# Patient Record
Sex: Female | Born: 1950
Health system: Southern US, Community
[De-identification: ages and names within clinical notes are randomized; demographics above are authoritative.]

## PROBLEM LIST (undated history)

## (undated) DIAGNOSIS — E785 Hyperlipidemia, unspecified: Secondary | ICD-10-CM

## (undated) DIAGNOSIS — K219 Gastro-esophageal reflux disease without esophagitis: Secondary | ICD-10-CM

## (undated) DIAGNOSIS — M810 Age-related osteoporosis without current pathological fracture: Secondary | ICD-10-CM

## (undated) HISTORY — DX: Age-related osteoporosis without current pathological fracture: M81.0

## (undated) HISTORY — PX: NASAL SINUS SURGERY: SHX719

## (undated) HISTORY — DX: Hyperlipidemia, unspecified: E78.5

## (undated) HISTORY — DX: Gastro-esophageal reflux disease without esophagitis: K21.9

---

## 1999-05-09 ENCOUNTER — Encounter: Admission: RE | Admit: 1999-05-09 | Discharge: 1999-05-09 | Payer: Self-pay | Admitting: Family Medicine

## 1999-05-09 ENCOUNTER — Encounter: Payer: Self-pay | Admitting: Family Medicine

## 1999-05-12 ENCOUNTER — Other Ambulatory Visit: Admission: RE | Admit: 1999-05-12 | Discharge: 1999-05-12 | Payer: Self-pay | Admitting: Urology

## 1999-05-17 ENCOUNTER — Encounter: Payer: Self-pay | Admitting: Urology

## 1999-05-17 ENCOUNTER — Encounter: Admission: RE | Admit: 1999-05-17 | Discharge: 1999-05-17 | Payer: Self-pay | Admitting: Urology

## 1999-06-22 ENCOUNTER — Encounter: Admission: RE | Admit: 1999-06-22 | Discharge: 1999-07-19 | Payer: Self-pay | Admitting: Neurology

## 2000-05-07 ENCOUNTER — Encounter: Payer: Self-pay | Admitting: Family Medicine

## 2000-05-07 ENCOUNTER — Encounter: Admission: RE | Admit: 2000-05-07 | Discharge: 2000-05-07 | Payer: Self-pay | Admitting: Family Medicine

## 2000-08-07 ENCOUNTER — Other Ambulatory Visit: Admission: RE | Admit: 2000-08-07 | Discharge: 2000-08-07 | Payer: Self-pay | Admitting: Family Medicine

## 2001-02-27 ENCOUNTER — Other Ambulatory Visit: Admission: RE | Admit: 2001-02-27 | Discharge: 2001-02-27 | Payer: Self-pay | Admitting: Obstetrics and Gynecology

## 2001-08-21 ENCOUNTER — Encounter: Admission: RE | Admit: 2001-08-21 | Discharge: 2001-08-21 | Payer: Self-pay | Admitting: Family Medicine

## 2001-08-21 ENCOUNTER — Encounter: Payer: Self-pay | Admitting: Family Medicine

## 2004-03-11 ENCOUNTER — Encounter: Admission: RE | Admit: 2004-03-11 | Discharge: 2004-03-11 | Payer: Self-pay | Admitting: Family Medicine

## 2004-03-11 ENCOUNTER — Ambulatory Visit: Payer: Self-pay | Admitting: Gastroenterology

## 2004-07-05 ENCOUNTER — Ambulatory Visit: Payer: Self-pay | Admitting: Gastroenterology

## 2005-04-07 ENCOUNTER — Ambulatory Visit (HOSPITAL_COMMUNITY): Admission: RE | Admit: 2005-04-07 | Discharge: 2005-04-07 | Payer: Self-pay | Admitting: Family Medicine

## 2006-04-12 ENCOUNTER — Ambulatory Visit (HOSPITAL_COMMUNITY): Admission: RE | Admit: 2006-04-12 | Discharge: 2006-04-12 | Payer: Self-pay | Admitting: Family Medicine

## 2006-04-20 ENCOUNTER — Ambulatory Visit (HOSPITAL_COMMUNITY): Admission: RE | Admit: 2006-04-20 | Discharge: 2006-04-20 | Payer: Self-pay | Admitting: Family Medicine

## 2007-08-23 ENCOUNTER — Ambulatory Visit (HOSPITAL_COMMUNITY): Admission: RE | Admit: 2007-08-23 | Discharge: 2007-08-23 | Payer: Self-pay | Admitting: Family Medicine

## 2009-08-06 ENCOUNTER — Ambulatory Visit (HOSPITAL_COMMUNITY): Admission: RE | Admit: 2009-08-06 | Discharge: 2009-08-06 | Payer: Self-pay | Admitting: Family Medicine

## 2010-09-22 NOTE — H&P (Signed)
NTS SOAP Note  Vital Signs:  Vitals as of: 09/22/2010: Systolic 156: Diastolic 74: Heart Rate 76: Temp 97.62F: Height 97ft 5in: Weight 177Lbs 0 Ounces: Pain Level 0: BMI 29  BMI : 29.45 kg/m2  Subjective: This 60 Years 57 Months old Female presents for of screening TCS. Denies any GI complaints except occassional heartburn. No family h/o colon carcinoma.  Review of Symptoms:  Constitutional: unremarkable  Head: unremarkable  Eyes:unremarkable  Nose/Mouth/Throat:unremarkable  Cardiovascular:unremarkable  Respiratory: unremarkable  Gastrointestin heartburn Genitourinary: unremarkable  Musculoskeletal: unremarkable  dry Hematolgic/Lymphatic: unremarkable  Allergic/Immunologic:unremarkable     Past Medical History:Reviewed  Past Medical History  Surgical History: sinus surgery Medical Problems: High cholesterol Allergies: nkda Medications: omeprazole, simvastatin   Social History: Reviewed   Social History  Preferred Language: English ([place]) Race: White Ethnicity: Not Hispanic / Latino Age: 60 Years 8 Months Marital Status: M Alcohol: No Recreational drug(s): No   Smoking Status: Current every day smoker reviewed on 09/22/2010 Started Date: 01/09/1970 Packs per day: 1.00   Family History: Reviewed  Family History  Maternal Grandmother: Cancer     Objective Information:  General: Well appearing, well nourished in no distress.  Head: Atraumatic; no masses; no abnormalities  Neck: Supple without lymphadenopathy.  Heart: RRR, no murmur or gallop. Normal S1, S2. No S3, S4.  Lungs:CTA bilaterally, no wheezes, rhonchi, rales. Breathing unlabored.  Abdomen: Soft, NT/ND, normal bowel sounds, no HSM, no masses. No peritoneal signs.  deferred to procedure  Assessment: Need for screening colonoscopy  Diagnosis & Procedure: DiagnosisCode: V76.51, ProcedureCode: 16109,   Orders:     Plan:Scheduled for TCS on 09/27/10.    Patient Education:  Alternative treatments to surgery were discussed with patient (and family).Risks and benefits of procedure were fully explained to the patient (and family) who gave informed consent. Patient/family questions were addressed.  Follow-up: Pending Surgery

## 2010-09-26 MED ORDER — SODIUM CHLORIDE 0.45 % IV SOLN: Freq: Once | INTRAVENOUS | Status: DC

## 2010-09-27 ENCOUNTER — Ambulatory Visit (HOSPITAL_COMMUNITY)
Admission: RE | Admit: 2010-09-27 | Discharge: 2010-09-27 | Disposition: A | Discharge status: Home / Self Care | Payer: 59 | Source: Ambulatory Visit | Attending: General Surgery | Admitting: General Surgery

## 2010-09-27 ENCOUNTER — Encounter (HOSPITAL_COMMUNITY)
Admission: RE | Disposition: A | Discharge status: Home / Self Care | Payer: Self-pay | Source: Ambulatory Visit | Attending: General Surgery

## 2010-09-27 DIAGNOSIS — E7800 Pure hypercholesterolemia, unspecified: Secondary | ICD-10-CM | POA: Insufficient documentation

## 2010-09-27 DIAGNOSIS — Z1211 Encounter for screening for malignant neoplasm of colon: Secondary | ICD-10-CM | POA: Insufficient documentation

## 2010-09-27 HISTORY — PX: COLONOSCOPY: SHX5424

## 2010-09-27 SURGERY — COLONOSCOPY
Anesthesia: Moderate Sedation

## 2010-09-27 MED ORDER — MIDAZOLAM HCL 5 MG/5ML IJ SOLN
INTRAMUSCULAR | Status: DC | PRN
  Administered 2010-09-27: 4 mg via INTRAVENOUS
  Administered 2010-09-27: 1 mg via INTRAVENOUS

## 2010-09-27 MED ORDER — MEPERIDINE HCL 25 MG/ML IJ SOLN
INTRAMUSCULAR | Status: DC | PRN
  Administered 2010-09-27 (×2): 25 mg via INTRAVENOUS

## 2010-09-27 MED ORDER — MEPERIDINE HCL 100 MG/ML IJ SOLN
INTRAMUSCULAR | Status: AC
  Filled 2010-09-27: qty 1

## 2010-09-27 MED ORDER — MIDAZOLAM HCL 5 MG/5ML IJ SOLN
INTRAMUSCULAR | Status: AC
  Filled 2010-09-27: qty 5

## 2010-09-27 MED ORDER — STERILE WATER FOR IRRIGATION IR SOLN
Status: DC | PRN
  Administered 2010-09-27: 10:00:00

## 2010-09-27 NOTE — Op Note (Signed)
Patient:  Cindy Butler  DOB:  02/10/1950  MRN:  [id number]   Preop Diagnosis: Need for screening colonoscopy    Postop Diagnosis:  Same  Procedure:  TCS  Surgeon:  Franky Macho, MD  Anes:  Demerol 50mg  iv, versed 5mg  iv  Indications:  Patient is a 60 year old white female presents for screening colonoscopy. Risks and benefits of the procedure and bleeding or perforation were fully explained to the patient and informed consent.   Procedure note:  Patient was placed in the left lateral decubitus position after placement of monitored anesthesia record. Demerol and Versed were used throughout the procedure for anesthesia. Rectal examination was performed which was negative. Colonoscopy to the cecum without difficulty. Confirmation of placement of the cecum was done using transabdominal palpation and landmarks. Bowel preparation was excellent. The cecum was fully visualized and noted to normal limits. The ascending colon, transverse colon, descending colon, sigmoid colon, and rectum were all within normal limits. No abnormal lesions were noted. The dentate line was inspected no difficult limits. Color was back within the colon and rectum prior to removal colonoscopy  The patient tolerated she well was transferred back to the patient in stable condition.  Complications:  None    Specimen: none  Recommendations: A followup colonoscopy is recommended in 10 years.

## 2010-10-05 ENCOUNTER — Encounter (HOSPITAL_COMMUNITY): Payer: Self-pay | Admitting: General Surgery

## 2012-12-12 ENCOUNTER — Ambulatory Visit (HOSPITAL_COMMUNITY)
Admission: RE | Admit: 2012-12-12 | Discharge: 2012-12-12 | Disposition: A | Discharge status: Home / Self Care | Payer: 59 | Source: Ambulatory Visit | Attending: Family Medicine | Admitting: Family Medicine

## 2012-12-12 ENCOUNTER — Other Ambulatory Visit (HOSPITAL_COMMUNITY): Payer: Self-pay | Admitting: Family Medicine

## 2012-12-12 DIAGNOSIS — J449 Chronic obstructive pulmonary disease, unspecified: Secondary | ICD-10-CM | POA: Insufficient documentation

## 2012-12-12 DIAGNOSIS — F17200 Nicotine dependence, unspecified, uncomplicated: Secondary | ICD-10-CM | POA: Insufficient documentation

## 2012-12-12 DIAGNOSIS — Z139 Encounter for screening, unspecified: Secondary | ICD-10-CM

## 2012-12-12 DIAGNOSIS — I700 Atherosclerosis of aorta: Secondary | ICD-10-CM | POA: Insufficient documentation

## 2012-12-12 DIAGNOSIS — J4489 Other specified chronic obstructive pulmonary disease: Secondary | ICD-10-CM | POA: Insufficient documentation

## 2012-12-13 ENCOUNTER — Ambulatory Visit (HOSPITAL_COMMUNITY)
Admission: RE | Admit: 2012-12-13 | Discharge: 2012-12-13 | Disposition: A | Discharge status: Home / Self Care | Payer: 59 | Source: Ambulatory Visit | Attending: Family Medicine | Admitting: Family Medicine

## 2012-12-13 DIAGNOSIS — Z139 Encounter for screening, unspecified: Secondary | ICD-10-CM

## 2012-12-13 DIAGNOSIS — Z1231 Encounter for screening mammogram for malignant neoplasm of breast: Secondary | ICD-10-CM | POA: Insufficient documentation

## 2015-01-27 DIAGNOSIS — Z6826 Body mass index (BMI) 26.0-26.9, adult: Secondary | ICD-10-CM | POA: Diagnosis not present

## 2015-01-27 DIAGNOSIS — Z23 Encounter for immunization: Secondary | ICD-10-CM | POA: Diagnosis not present

## 2015-01-27 DIAGNOSIS — E782 Mixed hyperlipidemia: Secondary | ICD-10-CM | POA: Diagnosis not present

## 2015-01-27 DIAGNOSIS — Z0001 Encounter for general adult medical examination with abnormal findings: Secondary | ICD-10-CM | POA: Diagnosis not present

## 2015-02-02 DIAGNOSIS — Z23 Encounter for immunization: Secondary | ICD-10-CM | POA: Diagnosis not present

## 2015-02-02 DIAGNOSIS — E782 Mixed hyperlipidemia: Secondary | ICD-10-CM | POA: Diagnosis not present

## 2015-02-02 DIAGNOSIS — Z1389 Encounter for screening for other disorder: Secondary | ICD-10-CM | POA: Diagnosis not present

## 2015-02-02 DIAGNOSIS — Z0001 Encounter for general adult medical examination with abnormal findings: Secondary | ICD-10-CM | POA: Diagnosis not present

## 2015-03-11 DIAGNOSIS — Z6827 Body mass index (BMI) 27.0-27.9, adult: Secondary | ICD-10-CM | POA: Diagnosis not present

## 2015-03-11 DIAGNOSIS — K219 Gastro-esophageal reflux disease without esophagitis: Secondary | ICD-10-CM | POA: Diagnosis not present

## 2015-03-11 DIAGNOSIS — E782 Mixed hyperlipidemia: Secondary | ICD-10-CM | POA: Diagnosis not present

## 2015-03-11 DIAGNOSIS — E663 Overweight: Secondary | ICD-10-CM | POA: Diagnosis not present

## 2015-03-11 DIAGNOSIS — Z1389 Encounter for screening for other disorder: Secondary | ICD-10-CM | POA: Diagnosis not present

## 2015-03-11 DIAGNOSIS — F17290 Nicotine dependence, other tobacco product, uncomplicated: Secondary | ICD-10-CM | POA: Diagnosis not present

## 2015-03-11 DIAGNOSIS — R5383 Other fatigue: Secondary | ICD-10-CM | POA: Diagnosis not present

## 2015-03-11 DIAGNOSIS — Z01411 Encounter for gynecological examination (general) (routine) with abnormal findings: Secondary | ICD-10-CM | POA: Diagnosis not present

## 2015-03-11 DIAGNOSIS — Z23 Encounter for immunization: Secondary | ICD-10-CM | POA: Diagnosis not present

## 2015-03-15 ENCOUNTER — Other Ambulatory Visit: Payer: Self-pay | Admitting: Family Medicine

## 2015-03-16 ENCOUNTER — Other Ambulatory Visit: Payer: Self-pay | Admitting: Family Medicine

## 2015-03-16 DIAGNOSIS — Z1231 Encounter for screening mammogram for malignant neoplasm of breast: Secondary | ICD-10-CM

## 2015-03-16 DIAGNOSIS — R5381 Other malaise: Secondary | ICD-10-CM

## 2015-03-22 ENCOUNTER — Other Ambulatory Visit: Payer: Self-pay | Admitting: Family Medicine

## 2015-03-22 DIAGNOSIS — M8580 Other specified disorders of bone density and structure, unspecified site: Secondary | ICD-10-CM

## 2015-04-12 ENCOUNTER — Ambulatory Visit
Admission: RE | Admit: 2015-04-12 | Discharge: 2015-04-12 | Disposition: A | Discharge status: Home / Self Care | Payer: Medicare Other | Source: Ambulatory Visit | Attending: Family Medicine | Admitting: Family Medicine

## 2015-04-12 DIAGNOSIS — Z780 Asymptomatic menopausal state: Secondary | ICD-10-CM | POA: Diagnosis not present

## 2015-04-12 DIAGNOSIS — Z1231 Encounter for screening mammogram for malignant neoplasm of breast: Secondary | ICD-10-CM

## 2015-04-12 DIAGNOSIS — M8580 Other specified disorders of bone density and structure, unspecified site: Secondary | ICD-10-CM

## 2015-04-12 DIAGNOSIS — M8589 Other specified disorders of bone density and structure, multiple sites: Secondary | ICD-10-CM | POA: Diagnosis not present

## 2015-08-09 ENCOUNTER — Ambulatory Visit (INDEPENDENT_AMBULATORY_CARE_PROVIDER_SITE_OTHER): Payer: Medicare Other | Admitting: Otolaryngology

## 2015-08-09 DIAGNOSIS — H903 Sensorineural hearing loss, bilateral: Secondary | ICD-10-CM | POA: Diagnosis not present

## 2015-08-09 DIAGNOSIS — H9313 Tinnitus, bilateral: Secondary | ICD-10-CM

## 2015-11-26 DIAGNOSIS — E782 Mixed hyperlipidemia: Secondary | ICD-10-CM | POA: Diagnosis not present

## 2015-11-26 DIAGNOSIS — R070 Pain in throat: Secondary | ICD-10-CM | POA: Diagnosis not present

## 2015-11-26 DIAGNOSIS — J343 Hypertrophy of nasal turbinates: Secondary | ICD-10-CM | POA: Diagnosis not present

## 2015-11-26 DIAGNOSIS — Z6826 Body mass index (BMI) 26.0-26.9, adult: Secondary | ICD-10-CM | POA: Diagnosis not present

## 2015-11-26 DIAGNOSIS — J069 Acute upper respiratory infection, unspecified: Secondary | ICD-10-CM | POA: Diagnosis not present

## 2015-11-26 DIAGNOSIS — K219 Gastro-esophageal reflux disease without esophagitis: Secondary | ICD-10-CM | POA: Diagnosis not present

## 2015-11-26 DIAGNOSIS — Z1389 Encounter for screening for other disorder: Secondary | ICD-10-CM | POA: Diagnosis not present

## 2016-01-25 DIAGNOSIS — T161XXA Foreign body in right ear, initial encounter: Secondary | ICD-10-CM | POA: Diagnosis not present

## 2016-01-25 DIAGNOSIS — Z1389 Encounter for screening for other disorder: Secondary | ICD-10-CM | POA: Diagnosis not present

## 2016-01-25 DIAGNOSIS — Z6826 Body mass index (BMI) 26.0-26.9, adult: Secondary | ICD-10-CM | POA: Diagnosis not present

## 2016-02-24 DIAGNOSIS — H903 Sensorineural hearing loss, bilateral: Secondary | ICD-10-CM | POA: Diagnosis not present

## 2016-04-25 DIAGNOSIS — Z23 Encounter for immunization: Secondary | ICD-10-CM | POA: Diagnosis not present

## 2016-04-25 DIAGNOSIS — Z6825 Body mass index (BMI) 25.0-25.9, adult: Secondary | ICD-10-CM | POA: Diagnosis not present

## 2016-04-25 DIAGNOSIS — Z719 Counseling, unspecified: Secondary | ICD-10-CM | POA: Diagnosis not present

## 2016-04-25 DIAGNOSIS — E663 Overweight: Secondary | ICD-10-CM | POA: Diagnosis not present

## 2016-04-25 DIAGNOSIS — Z0000 Encounter for general adult medical examination without abnormal findings: Secondary | ICD-10-CM | POA: Diagnosis not present

## 2016-05-04 DIAGNOSIS — E785 Hyperlipidemia, unspecified: Secondary | ICD-10-CM | POA: Diagnosis not present

## 2016-05-04 DIAGNOSIS — Z23 Encounter for immunization: Secondary | ICD-10-CM | POA: Diagnosis not present

## 2016-05-04 DIAGNOSIS — Z1389 Encounter for screening for other disorder: Secondary | ICD-10-CM | POA: Diagnosis not present

## 2016-05-04 DIAGNOSIS — Z0000 Encounter for general adult medical examination without abnormal findings: Secondary | ICD-10-CM | POA: Diagnosis not present

## 2016-05-05 ENCOUNTER — Other Ambulatory Visit (HOSPITAL_COMMUNITY): Payer: Self-pay | Admitting: Family Medicine

## 2016-05-05 DIAGNOSIS — Z1231 Encounter for screening mammogram for malignant neoplasm of breast: Secondary | ICD-10-CM

## 2016-05-08 ENCOUNTER — Ambulatory Visit (HOSPITAL_COMMUNITY)
Admission: RE | Admit: 2016-05-08 | Discharge: 2016-05-08 | Disposition: A | Discharge status: Home / Self Care | Payer: Medicare Other | Source: Ambulatory Visit | Attending: Family Medicine | Admitting: Family Medicine

## 2016-05-08 DIAGNOSIS — Z1231 Encounter for screening mammogram for malignant neoplasm of breast: Secondary | ICD-10-CM | POA: Insufficient documentation

## 2016-08-07 ENCOUNTER — Ambulatory Visit (INDEPENDENT_AMBULATORY_CARE_PROVIDER_SITE_OTHER): Payer: Medicare Other | Admitting: Otolaryngology

## 2016-08-07 DIAGNOSIS — H903 Sensorineural hearing loss, bilateral: Secondary | ICD-10-CM

## 2016-09-05 DIAGNOSIS — M25562 Pain in left knee: Secondary | ICD-10-CM | POA: Diagnosis not present

## 2017-04-26 DIAGNOSIS — J449 Chronic obstructive pulmonary disease, unspecified: Secondary | ICD-10-CM | POA: Diagnosis not present

## 2017-04-26 DIAGNOSIS — Z719 Counseling, unspecified: Secondary | ICD-10-CM | POA: Diagnosis not present

## 2017-04-26 DIAGNOSIS — K219 Gastro-esophageal reflux disease without esophagitis: Secondary | ICD-10-CM | POA: Diagnosis not present

## 2017-04-26 DIAGNOSIS — Z1389 Encounter for screening for other disorder: Secondary | ICD-10-CM | POA: Diagnosis not present

## 2017-04-26 DIAGNOSIS — Z6827 Body mass index (BMI) 27.0-27.9, adult: Secondary | ICD-10-CM | POA: Diagnosis not present

## 2017-04-26 DIAGNOSIS — Z0001 Encounter for general adult medical examination with abnormal findings: Secondary | ICD-10-CM | POA: Diagnosis not present

## 2017-04-30 DIAGNOSIS — Z6827 Body mass index (BMI) 27.0-27.9, adult: Secondary | ICD-10-CM | POA: Diagnosis not present

## 2017-04-30 DIAGNOSIS — Z1389 Encounter for screening for other disorder: Secondary | ICD-10-CM | POA: Diagnosis not present

## 2017-04-30 DIAGNOSIS — Z0001 Encounter for general adult medical examination with abnormal findings: Secondary | ICD-10-CM | POA: Diagnosis not present

## 2017-04-30 DIAGNOSIS — E782 Mixed hyperlipidemia: Secondary | ICD-10-CM | POA: Diagnosis not present

## 2017-05-10 DIAGNOSIS — Z1389 Encounter for screening for other disorder: Secondary | ICD-10-CM | POA: Diagnosis not present

## 2017-05-10 DIAGNOSIS — Z1231 Encounter for screening mammogram for malignant neoplasm of breast: Secondary | ICD-10-CM | POA: Diagnosis not present

## 2017-05-10 DIAGNOSIS — R0989 Other specified symptoms and signs involving the circulatory and respiratory systems: Secondary | ICD-10-CM | POA: Diagnosis not present

## 2017-05-10 DIAGNOSIS — Z0001 Encounter for general adult medical examination with abnormal findings: Secondary | ICD-10-CM | POA: Diagnosis not present

## 2017-05-22 DIAGNOSIS — Z6827 Body mass index (BMI) 27.0-27.9, adult: Secondary | ICD-10-CM | POA: Diagnosis not present

## 2017-05-22 DIAGNOSIS — J019 Acute sinusitis, unspecified: Secondary | ICD-10-CM | POA: Diagnosis not present

## 2017-05-22 DIAGNOSIS — J029 Acute pharyngitis, unspecified: Secondary | ICD-10-CM | POA: Diagnosis not present

## 2017-05-22 DIAGNOSIS — H938X3 Other specified disorders of ear, bilateral: Secondary | ICD-10-CM | POA: Diagnosis not present

## 2017-05-22 DIAGNOSIS — E663 Overweight: Secondary | ICD-10-CM | POA: Diagnosis not present

## 2017-05-22 DIAGNOSIS — R0982 Postnasal drip: Secondary | ICD-10-CM | POA: Diagnosis not present

## 2017-05-22 DIAGNOSIS — R0981 Nasal congestion: Secondary | ICD-10-CM | POA: Diagnosis not present

## 2017-05-22 DIAGNOSIS — R05 Cough: Secondary | ICD-10-CM | POA: Diagnosis not present

## 2017-06-07 DIAGNOSIS — M85851 Other specified disorders of bone density and structure, right thigh: Secondary | ICD-10-CM | POA: Diagnosis not present

## 2017-06-07 DIAGNOSIS — M85852 Other specified disorders of bone density and structure, left thigh: Secondary | ICD-10-CM | POA: Diagnosis not present

## 2017-06-07 DIAGNOSIS — M810 Age-related osteoporosis without current pathological fracture: Secondary | ICD-10-CM | POA: Diagnosis not present

## 2017-08-16 ENCOUNTER — Ambulatory Visit (INDEPENDENT_AMBULATORY_CARE_PROVIDER_SITE_OTHER): Payer: Medicare Other | Admitting: Otolaryngology

## 2018-05-16 DIAGNOSIS — E7849 Other hyperlipidemia: Secondary | ICD-10-CM | POA: Diagnosis not present

## 2018-05-16 DIAGNOSIS — E663 Overweight: Secondary | ICD-10-CM | POA: Diagnosis not present

## 2018-05-16 DIAGNOSIS — F17290 Nicotine dependence, other tobacco product, uncomplicated: Secondary | ICD-10-CM | POA: Diagnosis not present

## 2018-05-16 DIAGNOSIS — J449 Chronic obstructive pulmonary disease, unspecified: Secondary | ICD-10-CM | POA: Diagnosis not present

## 2018-05-16 DIAGNOSIS — Z6828 Body mass index (BMI) 28.0-28.9, adult: Secondary | ICD-10-CM | POA: Diagnosis not present

## 2018-05-16 DIAGNOSIS — K219 Gastro-esophageal reflux disease without esophagitis: Secondary | ICD-10-CM | POA: Diagnosis not present

## 2018-05-16 DIAGNOSIS — Z1389 Encounter for screening for other disorder: Secondary | ICD-10-CM | POA: Diagnosis not present

## 2018-05-28 ENCOUNTER — Inpatient Hospital Stay (HOSPITAL_COMMUNITY): Payer: Medicare Other

## 2018-05-28 ENCOUNTER — Inpatient Hospital Stay (HOSPITAL_COMMUNITY): Payer: Medicare Other | Attending: Hematology | Admitting: Hematology

## 2018-05-28 ENCOUNTER — Encounter (HOSPITAL_COMMUNITY): Payer: Self-pay | Admitting: Hematology

## 2018-05-28 ENCOUNTER — Other Ambulatory Visit: Payer: Self-pay

## 2018-05-28 VITALS — BP 149/59 | HR 76 | Temp 97.9°F | Resp 18 | Wt 170.5 lb

## 2018-05-28 DIAGNOSIS — J449 Chronic obstructive pulmonary disease, unspecified: Secondary | ICD-10-CM | POA: Diagnosis not present

## 2018-05-28 DIAGNOSIS — Z808 Family history of malignant neoplasm of other organs or systems: Secondary | ICD-10-CM | POA: Diagnosis not present

## 2018-05-28 DIAGNOSIS — D696 Thrombocytopenia, unspecified: Secondary | ICD-10-CM

## 2018-05-28 DIAGNOSIS — Z803 Family history of malignant neoplasm of breast: Secondary | ICD-10-CM

## 2018-05-28 DIAGNOSIS — Z807 Family history of other malignant neoplasms of lymphoid, hematopoietic and related tissues: Secondary | ICD-10-CM | POA: Diagnosis not present

## 2018-05-28 DIAGNOSIS — Z720 Tobacco use: Secondary | ICD-10-CM | POA: Diagnosis not present

## 2018-05-28 LAB — CBC WITH DIFFERENTIAL/PLATELET
Band Neutrophils: 0 %
Basophils Absolute: 0 10*3/uL (ref 0.0–0.1)
Basophils Relative: 0 %
Blasts: 0 %
Eosinophils Absolute: 0.1 10*3/uL (ref 0.0–0.5)
Eosinophils Relative: 2 %
HCT: 40.7 % (ref 36.0–46.0)
Hemoglobin: 13.4 g/dL (ref 12.0–15.0)
Lymphocytes Relative: 36 %
Lymphs Abs: 1.3 10*3/uL (ref 0.7–4.0)
MCH: 32.4 pg (ref 26.0–34.0)
MCHC: 32.9 g/dL (ref 30.0–36.0)
MCV: 98.5 fL (ref 80.0–100.0)
Metamyelocytes Relative: 0 %
Monocytes Absolute: 0.3 10*3/uL (ref 0.1–1.0)
Monocytes Relative: 8 %
Myelocytes: 0 %
Neutro Abs: 2 10*3/uL (ref 1.7–7.7)
Neutrophils Relative %: 54 %
Other: 0 %
Platelets: 142 10*3/uL — ABNORMAL LOW (ref 150–400)
Promyelocytes Relative: 0 %
RBC: 4.13 MIL/uL (ref 3.87–5.11)
RDW: 12.6 % (ref 11.5–15.5)
WBC: 3.7 10*3/uL — ABNORMAL LOW (ref 4.0–10.5)
nRBC: 0 % (ref 0.0–0.2)
nRBC: 0 /100 WBC

## 2018-05-28 LAB — SAVE SMEAR (SSMR)

## 2018-05-28 LAB — FOLATE: Folate: 7.3 ng/mL (ref >5.9)

## 2018-05-28 LAB — LACTATE DEHYDROGENASE: LDH: 159 U/L (ref 98–192)

## 2018-05-28 LAB — VITAMIN B12: Vitamin B-12: 222 pg/mL (ref 180–914)

## 2018-05-28 NOTE — Progress Notes (Signed)
[facility] Initial Visit:  Patient Care Team: Cindy Lincoln, MD as PCP - General (Family Medicine)  CHIEF COMPLAINTS/PURPOSE OF CONSULTATION: Consult for Thrombocytopenia  HISTORY OF PRESENTING ILLNESS: Cindy Butler 68 y.o. female presents for consult regarding newly diagnosed thrombocytopenia. She has a past medical history significant for hyperlipidemia, GERD, COPD, and tobacco abuse. She was recently seen by her PCP for routine follow up. Labs revealed  WBC:4.0, Hgb 14.0, Plt: 141k. She denies a history of thrombocytopenia. She denies any episodes of bleeding. She denies history of autoimmune disorders. Denies history of liver disease including Hepatitis B/C. She denies any fevers, chills, night sweats, no abnormal weight gain or loss. Denies any CP, SOB, lighthededness or dizziness. She has a family history positive for Myeloma in her brother, bone cancer in her mother, and maternal grandmother had breast cancer. She is a current every day smoker, smoking 1ppd  for greater than 50 years.    Review of Systems  Constitutional: Negative.   HENT:  Negative.   Eyes: Negative.   Respiratory: Negative.   Cardiovascular: Negative.   Gastrointestinal: Negative.   Endocrine: Negative.   Genitourinary: Negative.    Musculoskeletal: Negative.   Skin: Negative.   Neurological: Negative.   Hematological: Negative.   Psychiatric/Behavioral: Negative.     MEDICAL HISTORY: Past Medical History:  Diagnosis Date  . Esophageal reflux   . Hyperlipidemia   . Osteoporosis     SURGICAL HISTORY: Past Surgical History:  Procedure Laterality Date  . COLONOSCOPY  09/27/2010   Procedure: COLONOSCOPY;  Surgeon: Cindy Butler;  Location: [facility];  Service: Gastroenterology;  Laterality: N/A;  . NASAL SINUS SURGERY      SOCIAL HISTORY: Social History   Socioeconomic History  . Marital status: Married    Spouse name: Cindy Butler  . Number of children: 3  . Years  of education: Not on file  . Highest education level: Not on file  Occupational History  . Occupation: retired  Scientific laboratory technician  . Financial resource strain: Not hard at all  . Food insecurity:    Worry: Never true    Inability: Never true  . Transportation needs:    Medical: No    Non-medical: No  Tobacco Use  . Smoking status: Current Every Day Smoker    Packs/day: 0.50    Years: 55.00    Pack years: 27.50    Types: Cigarettes  . Smokeless tobacco: Never Used  Substance and Sexual Activity  . Alcohol use: Yes    Alcohol/week: 3.0 standard drinks    Types: 3 Standard drinks or equivalent per week  . Drug use: Never  . Sexual activity: Not on file  Lifestyle  . Physical activity:    Days per week: 0 days    Minutes per session: 0 min  . Stress: Not at all  Relationships  . Social connections:    Talks on phone: Twice a week    Gets together: Once a week    Attends religious service: 1 to 4 times per year    Active member of club or organization: No    Attends meetings of clubs or organizations: Never    Relationship status: Married  . Intimate partner violence:    Fear of current or ex partner: No    Emotionally abused: No    Physically abused: No    Forced sexual activity: No  Other Topics Concern  . Not on file  Social History Narrative  .  Not on file    FAMILY HISTORY Family History  Problem Relation Age of Onset  . Bone cancer Mother   . Alcohol abuse Father   . Melanoma Brother   . Breast cancer Maternal Grandmother     ALLERGIES:  has No Known Allergies.  MEDICATIONS:  Current Outpatient Medications  Medication Sig Dispense Refill  . aspirin EC 81 MG tablet Take 81 mg by mouth at bedtime.    . calcium-vitamin D 250-100 MG-UNIT tablet Take 1 tablet by mouth daily.    . ferrous sulfate 325 (65 FE) MG tablet Take 325 mg by mouth daily with breakfast.    . omeprazole (PRILOSEC) 40 MG capsule Take 40 mg by mouth daily.     . simvastatin (ZOCOR) 20 MG  tablet Take 10 mg by mouth daily at 6 PM.      No current facility-administered medications for this visit.     PHYSICAL EXAMINATION:  ECOG PERFORMANCE STATUS: 1 - Symptomatic but completely ambulatory   Vitals:   05/28/18 1305  BP: (!) 149/59  Pulse: 76  Resp: 18  Temp: 97.9 F (36.6 C)  SpO2: 100%    Filed Weights   05/28/18 1305  Weight: 170 lb 8 oz (77.3 kg)     Physical Exam Vitals signs reviewed.  Constitutional:      Appearance: Normal appearance.  Cardiovascular:     Rate and Rhythm: Normal rate and regular rhythm.     Heart sounds: Normal heart sounds.  Pulmonary:     Effort: Pulmonary effort is normal.     Breath sounds: Normal breath sounds.  Abdominal:     General: There is no distension.     Palpations: Abdomen is soft. There is no mass.  Musculoskeletal:        General: No swelling.  Lymphadenopathy:     Cervical: No cervical adenopathy.  Skin:    General: Skin is warm.  Neurological:     General: No focal deficit present.     Mental Status: She is alert and oriented to person, place, and time.  Psychiatric:        Mood and Affect: Mood normal.        Behavior: Behavior normal.      LABORATORY DATA: I have personally reviewed the data as listed:   RADIOGRAPHIC STUDIES: I have personally reviewed the radiological images as listed and agree with the findings in the report   ASSESSMENT/PLAN  Thrombocytopenia ([place]) 1.  Mild thrombocytopenia: -Recent labs on 05/16/2018 showed platelet count of 141 (lower limit of normal 150).  White count and hemoglobin was normal. -She does not have any history of easy bruising or bleeding.  Denies any prior history of transfusions. -No personal or family history of connective tissue disorders.  Denies any recent new medications. - Family history significant for brother who died of multiple myeloma in his 56s, mother had metastatic cancer to the bones in her 83s.  Paternal grandmother had breast  cancer. -Physical exam was within normal limits.  No lymphadenopathy or splenomegaly.  Denies any B symptoms. - We will repeat a CBC with differential today and review her smear.  We will check LDH level.  We will rule out nutritional deficiencies by checking D62, folic acid.  We will also check for hepatitis panel.  We will do screening test for lupus and rheumatoid arthritis. -We will see her back in 2 to 3 weeks to discuss the results.  2.  Smoking history: - After the  patient left, I have realized that patient has been smoking 1 pack/day since age 44. -I will talk to her about screening low-dose CT scan and its benefits at next visit.   Orders Placed This Encounter  Procedures  . CBC with Differential    Standing Status:   Future    Number of Occurrences:   1    Standing Expiration Date:   05/28/2019  . Vitamin B12    Standing Status:   Future    Number of Occurrences:   1    Standing Expiration Date:   05/28/2019  . Folate    Standing Status:   Future    Number of Occurrences:   1    Standing Expiration Date:   05/28/2019  . ANA, IFA (with reflex)    Standing Status:   Future    Number of Occurrences:   1    Standing Expiration Date:   05/28/2019  . Rheumatoid factor    Standing Status:   Future    Number of Occurrences:   1    Standing Expiration Date:   05/28/2019  . Lactate dehydrogenase    Standing Status:   Future    Number of Occurrences:   1    Standing Expiration Date:   05/28/2019  . Hepatitis B surface antigen    Standing Status:   Future    Number of Occurrences:   1    Standing Expiration Date:   05/28/2019  . Hepatitis B surface antibody    Standing Status:   Future    Number of Occurrences:   1    Standing Expiration Date:   05/28/2019  . Hepatitis C Antibody    Standing Status:   Future    Number of Occurrences:   1    Standing Expiration Date:   05/28/2019  . Save Smear (SSMR)    Standing Status:   Future    Number of Occurrences:   1    Standing  Expiration Date:   05/28/2019    All questions were answered. The patient knows to call the clinic with any problems, questions or concerns.  This note was electronically signed.    Derek Jack, MD  05/28/2018 3:32 PM

## 2018-05-28 NOTE — Patient Instructions (Signed)
[place] at [facility] Discharge Instructions  You were seen today by Dr. Delton Coombes. He went over your history, family history and recent lab results from your primary care doctor. He would like to get blood work today to further evaluate you. He will see you back in 3-4 weeks for follow up.   Thank you for choosing [place] at [facility] to provide your oncology and hematology care.  To afford each patient quality time with our provider, please arrive at least 15 minutes before your scheduled appointment time.   If you have a lab appointment with the [facility] please come in thru the  Main Entrance and check in at the main information desk  You need to re-schedule your appointment should you arrive 10 or more minutes late.  We strive to give you quality time with our providers, and arriving late affects you and other patients whose appointments are after yours.  Also, if you no show three or more times for appointments you may be dismissed from the clinic at the providers discretion.     Again, thank you for choosing [facility].  Our hope is that these requests will decrease the amount of time that you wait before being seen by our physicians.       _____________________________________________________________  Should you have questions after your visit to [facility], please contact our office at [phone] between the hours of 8:00 a.m. and 4:30 p.m.  Voicemails left after 4:00 p.m. will not be returned until the following business day.  For prescription refill requests, have your pharmacy contact our office and allow 72 hours.    Cancer Center Support Programs:   > Cancer Support Group  2nd Tuesday of the month 1pm-2pm, Journey Room

## 2018-05-28 NOTE — Assessment & Plan Note (Addendum)
1.  Mild thrombocytopenia: -Recent labs on 05/16/2018 showed platelet count of 141 (lower limit of normal 150).  White count and hemoglobin was normal. -She does not have any history of easy bruising or bleeding.  Denies any prior history of transfusions. -No personal or family history of connective tissue disorders.  Denies any recent new medications. - Family history significant for brother who died of multiple myeloma in his 10s, mother had metastatic cancer to the bones in her 60s.  Paternal grandmother had breast cancer. -Physical exam was within normal limits.  No lymphadenopathy or splenomegaly.  Denies any B symptoms. - We will repeat a CBC with differential today and review her smear.  We will check LDH level.  We will rule out nutritional deficiencies by checking C12, folic acid.  We will also check for hepatitis panel.  We will do screening test for lupus and rheumatoid arthritis. -We will see her back in 2 to 3 weeks to discuss the results.  2.  Smoking history: - After the patient left, I have realized that patient has been smoking 1 pack/day since age 68. -I will talk to her about screening low-dose CT scan and its benefits at next visit.

## 2018-05-29 LAB — HEPATITIS B SURFACE ANTIBODY,QUALITATIVE: Hep B S Ab: NONREACTIVE

## 2018-05-29 LAB — FANA STAINING PATTERNS: Homogeneous Pattern: 1:80 {titer}

## 2018-05-29 LAB — HEPATITIS B SURFACE ANTIGEN: Hepatitis B Surface Ag: NEGATIVE

## 2018-05-29 LAB — ANTINUCLEAR ANTIBODIES, IFA: ANA Ab, IFA: POSITIVE — AB

## 2018-05-29 LAB — RHEUMATOID FACTOR: Rhuematoid fact SerPl-aCnc: <10 IU/mL (ref 0.0–13.9)

## 2018-05-29 LAB — HEPATITIS C ANTIBODY: HCV Ab: <0.1 s/co ratio (ref 0.0–0.9)

## 2018-06-05 DIAGNOSIS — Z1389 Encounter for screening for other disorder: Secondary | ICD-10-CM | POA: Diagnosis not present

## 2018-06-05 DIAGNOSIS — Z0000 Encounter for general adult medical examination without abnormal findings: Secondary | ICD-10-CM | POA: Diagnosis not present

## 2018-06-05 DIAGNOSIS — Z681 Body mass index (BMI) 19 or less, adult: Secondary | ICD-10-CM | POA: Diagnosis not present

## 2018-06-05 DIAGNOSIS — Z23 Encounter for immunization: Secondary | ICD-10-CM | POA: Diagnosis not present

## 2018-06-24 ENCOUNTER — Ambulatory Visit (HOSPITAL_COMMUNITY): Payer: Medicare Other | Admitting: Hematology

## 2018-06-25 ENCOUNTER — Other Ambulatory Visit: Payer: Self-pay

## 2018-06-25 ENCOUNTER — Encounter (HOSPITAL_COMMUNITY): Payer: Self-pay | Admitting: Hematology

## 2018-06-25 ENCOUNTER — Inpatient Hospital Stay (HOSPITAL_COMMUNITY): Payer: Medicare Other | Attending: Hematology | Admitting: Hematology

## 2018-06-25 VITALS — BP 134/78 | HR 87 | Temp 97.9°F | Resp 18 | Wt 170.8 lb

## 2018-06-25 DIAGNOSIS — D696 Thrombocytopenia, unspecified: Secondary | ICD-10-CM

## 2018-06-25 DIAGNOSIS — E538 Deficiency of other specified B group vitamins: Secondary | ICD-10-CM | POA: Diagnosis not present

## 2018-06-25 DIAGNOSIS — Z720 Tobacco use: Secondary | ICD-10-CM | POA: Diagnosis not present

## 2018-06-25 DIAGNOSIS — Z87891 Personal history of nicotine dependence: Secondary | ICD-10-CM

## 2018-06-25 MED ORDER — CYANOCOBALAMIN 1000 MCG/ML IJ SOLN
1000.0000 ug | Freq: Once | INTRAMUSCULAR | Status: AC
  Administered 2018-06-25: 15:00:00 1000 ug via INTRAMUSCULAR
  Filled 2018-06-25: qty 1

## 2018-06-25 NOTE — Patient Instructions (Signed)
[place] at [facility] Discharge Instructions  You were seen today by Dr. Delton Coombes. He went over your recent lab results. He will see you back in 4 weeks for labs and follow up.  Start taking daily B12.  Thank you for choosing [place] at [facility] to provide your oncology and hematology care.  To afford each patient quality time with our provider, please arrive at least 15 minutes before your scheduled appointment time.   If you have a lab appointment with the [place] please come in thru the  Main Entrance and check in at the main information desk  You need to re-schedule your appointment should you arrive 10 or more minutes late.  We strive to give you quality time with our providers, and arriving late affects you and other patients whose appointments are after yours.  Also, if you no show three or more times for appointments you may be dismissed from the clinic at the providers discretion.     Again, thank you for choosing [facility].  Our hope is that these requests will decrease the amount of time that you wait before being seen by our physicians.       _____________________________________________________________  Should you have questions after your visit to [facility], please contact our office at [phone] between the hours of 8:00 a.m. and 4:30 p.m.  Voicemails left after 4:00 p.m. will not be returned until the following business day.  For prescription refill requests, have your pharmacy contact our office and allow 72 hours.    Cancer Center Support Programs:   > Cancer Support Group  2nd Tuesday of the month 1pm-2pm, Journey Room

## 2018-06-25 NOTE — Assessment & Plan Note (Signed)
1.  Mild thrombocytopenia: -Labs on 05/16/2018 showed platelet count of 141 with normal white count and hemoglobin. -He did not have any history of easy bruising or bleeding.  No family history of personal history of CT disorders. - Physical exam was within normal limits with no lymphadenopathy or splenomegaly.  No B symptoms. - We repeated her blood work which showed platelet count of 142 with a normal hemoglobin of 13.4.  White count was 3.7 with normal ANC. -ANA was weakly positive, 1:80.  Rheumatoid factor is negative.  She does not have any clinical signs or symptoms of lupus.  Folic acid and hepatitis panel were negative.  LDH was normal. -B12 was borderline low at 222.  She will be given a B12 injection today.  She was told to take 1 mg B12 tablet sublingually every day. -We will see her back in 4 weeks and repeat CBC and B12 level.  2.  Smoking history: -She has been smoking 1 pack/day since age 60. -I have discussed the recommendations of low-dose CT scan and its implications in detail.  She is agreeable for low-dose screening CT of the chest.  We will schedule it prior to next visit.

## 2018-06-25 NOTE — Progress Notes (Signed)
Patient tolerated injection with no complaints voiced.  Site clean and dry with no bruising or swelling noted at site.  Band aid applied.  Vss with discharge and left ambulatory with no s/s of distress noted.  

## 2018-06-25 NOTE — Progress Notes (Signed)
Patient Care Team: Elsie Lincoln, MD as PCP - General (Family Medicine)  DIAGNOSIS:  Encounter Diagnoses  Name Primary?  . B12 deficiency Yes  . Smoking history   . Stopped smoking with greater than 30 pack year history   . Thrombocytopenia ([facility])       CHIEF COMPLIANT: Mild thrombocytopenia.  INTERVAL HISTORY: Cindy Butler is a 68 year old very pleasant female seen for follow-up of mild thrombocytopenia.  She denied any easy bruising or bleeding.  Denies any fevers, night sweats or weight loss.  Appetite and energy levels are 100%.  No pain is reported.  She is active smoker, smokes 1 pack/day of cigarettes since age 76.  Denies any chest pains or weight loss.  No cough or hemoptysis was reported.  Denies any tingling or numbness in extremities.  No recent ER visits or hospitalizations.  REVIEW OF SYSTEMS:   Constitutional: Denies fevers, chills or abnormal weight loss Eyes: Denies blurriness of vision Ears, nose, mouth, throat, and face: Denies mucositis or sore throat Respiratory: Denies cough, dyspnea or wheezes Cardiovascular: Denies palpitation, chest discomfort Gastrointestinal:  Denies nausea, heartburn or change in bowel habits Skin: Denies abnormal skin rashes Lymphatics: Denies new lymphadenopathy or easy bruising Neurological:Denies numbness, tingling or new weaknesses Behavioral/Psych: Mood is stable, no new changes  Extremities: No lower extremity edema All other systems were reviewed with the patient and are negative.  I have reviewed the past medical history, past surgical history, social history and family history with the patient and they are unchanged from previous note.  ALLERGIES:  has No Known Allergies.  MEDICATIONS:  Current Outpatient Medications  Medication Sig Dispense Refill  . aspirin EC 81 MG tablet Take 81 mg by mouth at bedtime.    . calcium-vitamin D 250-100 MG-UNIT tablet Take 1 tablet by mouth daily.    . ferrous sulfate 325 (65  FE) MG tablet Take 325 mg by mouth daily with breakfast.    . omeprazole (PRILOSEC) 40 MG capsule Take 40 mg by mouth daily.     . simvastatin (ZOCOR) 20 MG tablet Take 10 mg by mouth daily at 6 PM.      No current facility-administered medications for this visit.     PHYSICAL EXAMINATION: ECOG PERFORMANCE STATUS: 1 - Symptomatic but completely ambulatory  Vitals:   06/25/18 1454  BP: 134/78  Pulse: 87  Resp: 18  Temp: 97.9 F (36.6 C)  SpO2: 100%   Filed Weights   06/25/18 1454  Weight: 170 lb 12.8 oz (77.5 kg)    GENERAL:alert, no distress and comfortable SKIN: skin color, texture, turgor are normal, no rashes or significant lesions EYES: normal, Conjunctiva are pink and non-injected, sclera clear OROPHARYNX:no mucositis, no erythema and lips, buccal mucosa, and tongue normal  NECK: supple, thyroid normal size, non-tender, without nodularity LYMPH:  no palpable lymphadenopathy in the cervical, axillary or inguinal LUNGS: clear to auscultation and percussion with normal breathing effort HEART: regular rate & rhythm and no murmurs and no lower extremity edema ABDOMEN:abdomen soft, non-tender and normal bowel sounds MUSCULOSKELETAL:no cyanosis of digits and no clubbing  EXTREMITIES: No lower extremity edema   LABORATORY DATA:  I have reviewed the data as listed   Lab Results  Component Value Date   WBC 3.7 (L) 05/28/2018   HGB 13.4 05/28/2018   HCT 40.7 05/28/2018   MCV 98.5 05/28/2018   PLT 142 (L) 05/28/2018   NEUTROABS 2.0 05/28/2018    ASSESSMENT & PLAN:  Thrombocytopenia ([place]) 1.  Mild thrombocytopenia: -Labs on 05/16/2018 showed platelet count of 141 with normal white count and hemoglobin. -He did not have any history of easy bruising or bleeding.  No family history of personal history of CT disorders. - Physical exam was within normal limits with no lymphadenopathy or splenomegaly.  No B symptoms. - We repeated her blood work which showed platelet count of  142 with a normal hemoglobin of 13.4.  White count was 3.7 with normal ANC. -ANA was weakly positive, 1:80.  Rheumatoid factor is negative.  She does not have any clinical signs or symptoms of lupus.  Folic acid and hepatitis panel were negative.  LDH was normal. -B12 was borderline low at 222.  She will be given a B12 injection today.  She was told to take 1 mg B12 tablet sublingually every day. -We will see her back in 4 weeks and repeat CBC and B12 level.  2.  Smoking history: -She has been smoking 1 pack/day since age 86. -I have discussed the recommendations of low-dose CT scan and its implications in detail.  She is agreeable for low-dose screening CT of the chest.  We will schedule it prior to next visit.    I spent 25 minutes talking to the patient of which more than half was spent in counseling and coordination of care.  Orders Placed This Encounter  Procedures  . CT CHEST LUNG CA SCREEN LOW DOSE W/O CM    Standing Status:   Future    Standing Expiration Date:   08/25/2019    Order Specific Question:   ** REASON FOR EXAM (FREE TEXT)    Answer:   40 + year smoking history    Order Specific Question:   Reason for Exam (SYMPTOM  OR DIAGNOSIS REQUIRED)    Answer:   39 + year smoking history    Order Specific Question:   Preferred Imaging Location?    Answer:   [facility]    Order Specific Question:   Radiology Contrast Protocol - do NOT remove file path    Answer:   [email or web address]  . CBC with Differential    Standing Status:   Future    Standing Expiration Date:   06/25/2019  . Vitamin B12    Standing Status:   Future    Standing Expiration Date:   06/25/2019   The patient has a good understanding of the overall plan. she agrees with it. she will call with any problems that may develop before the next visit here.   Derek Jack, MD 06/25/18

## 2018-07-25 ENCOUNTER — Inpatient Hospital Stay (HOSPITAL_COMMUNITY): Payer: Medicare Other | Attending: Hematology

## 2018-07-25 ENCOUNTER — Other Ambulatory Visit: Payer: Self-pay

## 2018-07-25 ENCOUNTER — Ambulatory Visit (HOSPITAL_COMMUNITY)
Admission: RE | Admit: 2018-07-25 | Discharge: 2018-07-25 | Disposition: A | Discharge status: Home / Self Care | Payer: Medicare Other | Source: Ambulatory Visit | Attending: Hematology | Admitting: Hematology

## 2018-07-25 ENCOUNTER — Other Ambulatory Visit: Payer: Medicare Other

## 2018-07-25 DIAGNOSIS — Z720 Tobacco use: Secondary | ICD-10-CM | POA: Insufficient documentation

## 2018-07-25 DIAGNOSIS — F17210 Nicotine dependence, cigarettes, uncomplicated: Secondary | ICD-10-CM | POA: Diagnosis not present

## 2018-07-25 DIAGNOSIS — Z20822 Contact with and (suspected) exposure to covid-19: Secondary | ICD-10-CM

## 2018-07-25 DIAGNOSIS — R911 Solitary pulmonary nodule: Secondary | ICD-10-CM | POA: Diagnosis not present

## 2018-07-25 DIAGNOSIS — Z87891 Personal history of nicotine dependence: Secondary | ICD-10-CM | POA: Insufficient documentation

## 2018-07-25 DIAGNOSIS — R6889 Other general symptoms and signs: Secondary | ICD-10-CM | POA: Diagnosis not present

## 2018-07-25 DIAGNOSIS — E538 Deficiency of other specified B group vitamins: Secondary | ICD-10-CM

## 2018-07-25 DIAGNOSIS — D696 Thrombocytopenia, unspecified: Secondary | ICD-10-CM | POA: Diagnosis not present

## 2018-07-25 LAB — CBC WITH DIFFERENTIAL/PLATELET
Abs Immature Granulocytes: 0 10*3/uL (ref 0.00–0.07)
Basophils Absolute: 0 10*3/uL (ref 0.0–0.1)
Basophils Relative: 0 %
Eosinophils Absolute: 0.1 10*3/uL (ref 0.0–0.5)
Eosinophils Relative: 2 %
HCT: 42.5 % (ref 36.0–46.0)
Hemoglobin: 13.7 g/dL (ref 12.0–15.0)
Immature Granulocytes: 0 %
Lymphocytes Relative: 33 %
Lymphs Abs: 1.5 10*3/uL (ref 0.7–4.0)
MCH: 31.9 pg (ref 26.0–34.0)
MCHC: 32.2 g/dL (ref 30.0–36.0)
MCV: 99.1 fL (ref 80.0–100.0)
Monocytes Absolute: 0.5 10*3/uL (ref 0.1–1.0)
Monocytes Relative: 10 %
Neutro Abs: 2.5 10*3/uL (ref 1.7–7.7)
Neutrophils Relative %: 55 %
Platelets: 156 10*3/uL (ref 150–400)
RBC: 4.29 MIL/uL (ref 3.87–5.11)
RDW: 13.2 % (ref 11.5–15.5)
WBC: 4.6 10*3/uL (ref 4.0–10.5)
nRBC: 0 % (ref 0.0–0.2)

## 2018-07-25 LAB — VITAMIN B12: Vitamin B-12: 576 pg/mL (ref 180–914)

## 2018-07-26 ENCOUNTER — Other Ambulatory Visit (HOSPITAL_COMMUNITY): Payer: Self-pay | Admitting: Nurse Practitioner

## 2018-07-26 DIAGNOSIS — R911 Solitary pulmonary nodule: Secondary | ICD-10-CM

## 2018-07-29 ENCOUNTER — Encounter (HOSPITAL_COMMUNITY)
Admission: RE | Admit: 2018-07-29 | Discharge: 2018-07-29 | Disposition: A | Discharge status: Home / Self Care | Payer: Medicare Other | Source: Ambulatory Visit | Attending: Nurse Practitioner | Admitting: Nurse Practitioner

## 2018-07-29 ENCOUNTER — Other Ambulatory Visit: Payer: Self-pay

## 2018-07-29 DIAGNOSIS — R918 Other nonspecific abnormal finding of lung field: Secondary | ICD-10-CM | POA: Diagnosis not present

## 2018-07-29 DIAGNOSIS — R911 Solitary pulmonary nodule: Secondary | ICD-10-CM | POA: Diagnosis not present

## 2018-07-29 LAB — NOVEL CORONAVIRUS, NAA: SARS-CoV-2, NAA: NOT DETECTED

## 2018-07-29 MED ORDER — FLUDEOXYGLUCOSE F - 18 (FDG) INJECTION
9.1200 | Freq: Once | INTRAVENOUS | Status: AC | PRN
  Administered 2018-07-29: 9.12 via INTRAVENOUS

## 2018-07-30 ENCOUNTER — Inpatient Hospital Stay (HOSPITAL_BASED_OUTPATIENT_CLINIC_OR_DEPARTMENT_OTHER): Payer: Medicare Other | Admitting: Hematology

## 2018-07-30 ENCOUNTER — Encounter (HOSPITAL_COMMUNITY): Payer: Self-pay | Admitting: Hematology

## 2018-07-30 VITALS — BP 148/61 | HR 65 | Temp 97.8°F | Resp 18 | Wt 170.6 lb

## 2018-07-30 DIAGNOSIS — D696 Thrombocytopenia, unspecified: Secondary | ICD-10-CM

## 2018-07-30 DIAGNOSIS — R911 Solitary pulmonary nodule: Secondary | ICD-10-CM | POA: Diagnosis not present

## 2018-07-30 DIAGNOSIS — Z720 Tobacco use: Secondary | ICD-10-CM | POA: Diagnosis not present

## 2018-07-30 NOTE — Progress Notes (Signed)
Cindy Butler, [place] [address]   CLINIC:  Medical Oncology/Hematology  PCP:  Cindy Butler, [place] [address] [phone]   REASON FOR VISIT:  Follow-up for right lung nodule and thrombocytopenia.   INTERVAL HISTORY:  Cindy Butler 68 y.o. female seen for follow-up of right upper lobe lung nodule and thrombocytopenia.  She had an abnormal CT of the chest for lung cancer screening protocol with right upper lobe lung nodule.  We have scheduled her for a PET CT scan.  She denies any cough or hemoptysis.  No cough recent weight loss reported.  No fevers or night sweats.  She continues to smoke 1 pack/day for the last 55 years.  No chest pains reported.  Appetite is 100%.  Energy levels are 75%.    REVIEW OF SYSTEMS:  Review of Systems  All other systems reviewed and are negative.    PAST MEDICAL/SURGICAL HISTORY:  Past Medical History:  Diagnosis Date  . Esophageal reflux   . Hyperlipidemia   . Osteoporosis    Past Surgical History:  Procedure Laterality Date  . COLONOSCOPY  09/27/2010   Procedure: COLONOSCOPY;  Surgeon: Jamesetta So;  Location[facility];  Service: Gastroenterology;  Laterality: N/A;  . NASAL SINUS SURGERY       SOCIAL HISTORY:  Social History   Socioeconomic History  . Marital status: Married    Spouse name: Aaron Edelman  . Number of children: 3  . Years of education: Not on file  . Highest education level: Not on file  Occupational History  . Occupation: retired  Scientific laboratory technician  . Financial resource strain: Not hard at all  . Food insecurity    Worry: Never true    Inability: Never true  . Transportation needs    Medical: No    Non-medical: No  Tobacco Use  . Smoking status: Current Every Day Smoker    Packs/day: 0.50    Years: 55.00    Pack years: 27.50    Types: Cigarettes  . Smokeless tobacco: Never Used  Substance and Sexual Activity  . Alcohol use: Yes   Alcohol/week: 3.0 standard drinks    Types: 3 Standard drinks or equivalent per week  . Drug use: Never  . Sexual activity: Not on file  Lifestyle  . Physical activity    Days per week: 0 days    Minutes per session: 0 min  . Stress: Not at all  Relationships  . Social Herbalist on phone: Twice a week    Gets together: Once a week    Attends religious service: 1 to 4 times per year    Active member of club or organization: No    Attends meetings of clubs or organizations: Never    Relationship status: Married  . Intimate partner violence    Fear of current or ex partner: No    Emotionally abused: No    Physically abused: No    Forced sexual activity: No  Other Topics Concern  . Not on file  Social History Narrative  . Not on file    FAMILY HISTORY:  Family History  Problem Relation Age of Onset  . Bone cancer Mother   . Alcohol abuse Father   . Melanoma Brother   . Breast cancer Maternal Grandmother     CURRENT MEDICATIONS:  Outpatient Encounter Medications as of 07/30/2018  Medication Sig  . aspirin EC 81 MG tablet Take  81 mg by mouth at bedtime.  . calcium-vitamin D 250-100 MG-UNIT tablet Take 1 tablet by mouth daily.  . ferrous sulfate 325 (65 FE) MG tablet Take 325 mg by mouth daily with breakfast.  . omeprazole (PRILOSEC) 40 MG capsule Take 40 mg by mouth daily.   . simvastatin (ZOCOR) 20 MG tablet Take 10 mg by mouth daily at 6 PM.   . vitamin B-12 (CYANOCOBALAMIN) 100 MCG tablet Take 1 mcg by mouth daily. Pt takes liquid form of Vitamin B-12 1 ml daily   No facility-administered encounter medications on file as of 07/30/2018.     ALLERGIES:  No Known Allergies   PHYSICAL EXAM:  ECOG Performance status: 1  Vitals:   07/30/18 1039  BP: (!) 148/61  Pulse: 65  Resp: 18  Temp: 97.8 F (36.6 C)  SpO2: 100%   Filed Weights   07/30/18 1039  Weight: 170 lb 9.6 oz (77.4 kg)    Physical Exam Vitals signs reviewed.  Constitutional:       Appearance: Normal appearance.  Cardiovascular:     Rate and Rhythm: Normal rate and regular rhythm.     Heart sounds: Normal heart sounds.  Pulmonary:     Effort: Pulmonary effort is normal.     Breath sounds: Normal breath sounds.  Abdominal:     General: There is no distension.     Palpations: Abdomen is soft. There is no mass.  Musculoskeletal:        General: No swelling.  Lymphadenopathy:     Cervical: No cervical adenopathy.  Skin:    General: Skin is warm.  Neurological:     General: No focal deficit present.     Mental Status: She is alert and oriented to person, place, and time.  Psychiatric:        Mood and Affect: Mood normal.        Behavior: Behavior normal.      LABORATORY DATA:  I have reviewed the labs as listed.  CBC    Component Value Date/Time   WBC 4.6 07/25/2018 1003   RBC 4.29 07/25/2018 1003   HGB 13.7 07/25/2018 1003   HCT 42.5 07/25/2018 1003   PLT 156 07/25/2018 1003   MCV 99.1 07/25/2018 1003   MCH 31.9 07/25/2018 1003   MCHC 32.2 07/25/2018 1003   RDW 13.2 07/25/2018 1003   LYMPHSABS 1.5 07/25/2018 1003   MONOABS 0.5 07/25/2018 1003   EOSABS 0.1 07/25/2018 1003   BASOSABS 0.0 07/25/2018 1003   No flowsheet data found.     DIAGNOSTIC IMAGING:  I have independently reviewed the scans and discussed with the patient.   I have reviewed Venita Lick LPN's note and agree with the documentation.  I personally performed a face-to-face visit, made revisions and my assessment and plan is as follows.    ASSESSMENT & PLAN:   Nodule of upper lobe of right lung 1.  Right upper lobe lung nodule: -She smoked 1 pack/day for the last 55 years. -CT low-dose lung scan on 07/25/2018 shows lung RADS 4 AAA, dominant irregular 12.2 mm medial right upper lobe pulmonary nodule.  Mild right paratracheal adenopathy nonspecific. -PET/CT scan on 07/29/2018 shows dominant spiculated right upper lobe nodule maximum SUV of 2.3, suspicious for low-grade  malignancy.  There is an adjacent part solid 0.6 cm right upper lobe pulmonary nodule with maximum SUV of 1.2, nonspecific.  No hypermetabolic adenopathy. -Based on these findings, I have recommended consultation with Dr. Roxan Hockey. -I will see  her back in 3 months for follow-up.  2.  Thrombocytopenia: -Prior labs showed very mild thrombocytopenia. -ANA was weakly positive, 1:80.  Rheumatoid factor is negative.  Folic acid and hepatitis panel, LDH were normal. -B12 was borderline at 222.  We will give her B12 injection and started her on B12 1 mg daily. -Labs on 07/25/2018 shows normal platelet count and B12 level. -We will repeat in 3 months.       Total time spent is 25 minutes with more than 50% of the time spent face-to-face discussing results, further plan, counseling and coordination of care.    Orders placed this encounter:  Orders Placed This Encounter  Procedures  . CBC with Differential  . Vitamin B12      Derek Jack, MD [place] [phone]

## 2018-07-30 NOTE — Patient Instructions (Addendum)
[place] at [facility] Discharge Instructions  You were seen today by Dr. Delton Coombes. He went over your recent scan results. He will see you back in 3 months for labs and follow up.   Thank you for choosing [place] at [facility] to provide your oncology and hematology care.  To afford each patient quality time with our provider, please arrive at least 15 minutes before your scheduled appointment time.   If you have a lab appointment with the [facility] please come in thru the  Main Entrance and check in at the main information desk  You need to re-schedule your appointment should you arrive 10 or more minutes late.  We strive to give you quality time with our providers, and arriving late affects you and other patients whose appointments are after yours.  Also, if you no show three or more times for appointments you may be dismissed from the clinic at the providers discretion.     Again, thank you for choosing [facility].  Our hope is that these requests will decrease the amount of time that you wait before being seen by our physicians.       _____________________________________________________________  Should you have questions after your visit to [facility], please contact our office at [phone] between the hours of 8:00 a.m. and 4:30 p.m.  Voicemails left after 4:00 p.m. will not be returned until the following business day.  For prescription refill requests, have your pharmacy contact our office and allow 72 hours.    Cancer Center Support Programs:   > Cancer Support Group  2nd Tuesday of the month 1pm-2pm, Journey Room

## 2018-07-30 NOTE — Assessment & Plan Note (Signed)
1.  Right upper lobe lung nodule: -She smoked 1 pack/day for the last 55 years. -CT low-dose lung scan on 07/25/2018 shows lung RADS 4 AAA, dominant irregular 12.2 mm medial right upper lobe pulmonary nodule.  Mild right paratracheal adenopathy nonspecific. -PET/CT scan on 07/29/2018 shows dominant spiculated right upper lobe nodule maximum SUV of 2.3, suspicious for low-grade malignancy.  There is an adjacent part solid 0.6 cm right upper lobe pulmonary nodule with maximum SUV of 1.2, nonspecific.  No hypermetabolic adenopathy. -Based on these findings, I have recommended consultation with Dr. Roxan Hockey. -I will see her back in 3 months for follow-up.  2.  Thrombocytopenia: -Prior labs showed very mild thrombocytopenia. -ANA was weakly positive, 1:80.  Rheumatoid factor is negative.  Folic acid and hepatitis panel, LDH were normal. -B12 was borderline at 222.  We will give her B12 injection and started her on B12 1 mg daily. -Labs on 07/25/2018 shows normal platelet count and B12 level. -We will repeat in 3 months.

## 2018-08-01 ENCOUNTER — Telehealth (HOSPITAL_COMMUNITY): Payer: Self-pay | Admitting: Surgery

## 2018-08-01 NOTE — Telephone Encounter (Signed)
Pt had left a message wanting to know her PET scan results.  Spoke to [place], and she stated that the PET scan is concerning for malignancy, and that Dr. Delton Coombes has referred the pt to Dr. Roxan Hockey.  Dr. Delton Coombes has the pt scheduled for follow up with [place] in 3 months.  Pt verbalized understanding of this information and was told to call our office back if she had any more questions or concerns

## 2018-08-07 ENCOUNTER — Other Ambulatory Visit: Payer: Self-pay | Admitting: *Deleted

## 2018-08-07 ENCOUNTER — Institutional Professional Consult (permissible substitution) (INDEPENDENT_AMBULATORY_CARE_PROVIDER_SITE_OTHER): Payer: Medicare Other | Admitting: Thoracic Surgery (Cardiothoracic Vascular Surgery)

## 2018-08-07 ENCOUNTER — Encounter: Payer: Self-pay | Admitting: Thoracic Surgery (Cardiothoracic Vascular Surgery)

## 2018-08-07 ENCOUNTER — Other Ambulatory Visit: Payer: Self-pay

## 2018-08-07 VITALS — BP 144/71 | HR 71 | Temp 98.1°F | Resp 16 | Ht 65.0 in | Wt 170.0 lb

## 2018-08-07 DIAGNOSIS — R911 Solitary pulmonary nodule: Secondary | ICD-10-CM

## 2018-08-07 DIAGNOSIS — D381 Neoplasm of uncertain behavior of trachea, bronchus and lung: Secondary | ICD-10-CM | POA: Diagnosis not present

## 2018-08-07 NOTE — H&P (View-Only) (Signed)
PCP is Elsie Lincoln, MD Referring Provider is Derek Jack, MD  Chief Complaint  Patient presents with  . Lung Lesion    RULobe per LO DOSE CANCER SCREENING CHEST CT 07/25/18, PET 07/29/18    HPI: Cindy Butler is sent for consultation regarding right upper lobe lung nodules.  Cindy Butler is a 68 year old woman with a history of tobacco abuse, hyperlipidemia, reflux, osteoporosis, and recently diagnosed thrombocytopenia.  She recently had a annual physical.  Blood work showed thrombocytopenia.  She was referred to Dr. Delton Coombes.  She also was referred for low-dose CT screening due to her smoking history.  The LD CT showed a spiculated nodule in the right upper lobe measuring 1.2 cm.  There was a second more peripheral nodule approximately 6 mm in diameter.  A PET/CT showed the dominant nodule was hypermetabolic with an SUV of 2.3.  There also was some minimal uptake in the 6 mm nodule with an SUV of 1.2.  There was no significant adenopathy or evidence of metastatic disease.  She is still smoking.  She smoked about a pack a day for 50 years.  She has cut back a little.  She has been resistant to trying a patch.  She denies cough, hemoptysis, and wheezing.  She denies chest pain, pressure, tightness, or shortness of breath with exertion.  She can walk up a flight of stairs without stopping.  Her appetite is good.  She denies any weight loss.  Zubrod Score: At the time of surgery this patient's most appropriate activity status/level should be described as: [x]     0    Normal activity, no symptoms []     1    Restricted in physical strenuous activity but ambulatory, able to do out light work []     2    Ambulatory and capable of self care, unable to do work activities, up and about >50 % of waking hours                              []     3    Only limited self care, in bed greater than 50% of waking hours []     4    Completely disabled, no self care, confined to bed or chair []     5     Moribund  Past Medical History:  Diagnosis Date  . Esophageal reflux   . Hyperlipidemia   . Osteoporosis     Past Surgical History:  Procedure Laterality Date  . COLONOSCOPY  09/27/2010   Procedure: COLONOSCOPY;  Surgeon: Jamesetta So;  Location: [facility];  Service: Gastroenterology;  Laterality: N/A;  . NASAL SINUS SURGERY      Family History  Problem Relation Age of Onset  . Bone cancer Mother   . Alcohol abuse Father   . Melanoma Brother   . Breast cancer Maternal Grandmother     Social History Social History   Tobacco Use  . Smoking status: Current Every Day Smoker    Packs/day: 0.50    Years: 55.00    Pack years: 27.50    Types: Cigarettes  . Smokeless tobacco: Never Used  Substance Use Topics  . Alcohol use: Yes    Alcohol/week: 3.0 standard drinks    Types: 3 Standard drinks or equivalent per week  . Drug use: Never    Current Outpatient Medications  Medication Sig Dispense Refill  . aspirin EC 81 MG tablet Take 81  mg by mouth at bedtime.    . calcium-vitamin D 250-100 MG-UNIT tablet Take 1 tablet by mouth daily.    . ferrous sulfate 325 (65 FE) MG tablet Take 325 mg by mouth daily with breakfast.    . omeprazole (PRILOSEC) 40 MG capsule Take 40 mg by mouth daily.     . simvastatin (ZOCOR) 20 MG tablet Take 10 mg by mouth daily at 6 PM.     . vitamin B-12 (CYANOCOBALAMIN) 100 MCG tablet Take 1 mcg by mouth daily. Pt takes liquid form of Vitamin B-12 1 ml daily     No current facility-administered medications for this visit.     No Known Allergies  Review of Systems  Constitutional: Negative for activity change, appetite change and unexpected weight change.  HENT: Negative for trouble swallowing and voice change.   Eyes: Negative for visual disturbance.  Respiratory: Negative for cough, shortness of breath and wheezing.   Cardiovascular: Negative for chest pain, palpitations and leg swelling.  Genitourinary: Negative for difficulty urinating  and dysuria.  Musculoskeletal: Negative for arthralgias and back pain.  Neurological: Negative for syncope and weakness.  Hematological: Negative for adenopathy. Bruises/bleeds easily.  All other systems reviewed and are negative.   BP (!) 144/71 (BP Location: Left Arm, Patient Position: Sitting, Cuff Size: Normal)   Pulse 71   Temp 98.1 F (36.7 C) Comment: THERMAL  Resp 16   Ht 5\' 5"  (1.651 m)   Wt 170 lb (77.1 kg)   SpO2 96% Comment: RA  BMI 28.29 kg/m  Physical Exam Vitals signs reviewed.  Constitutional:      General: She is not in acute distress.    Appearance: Normal appearance.  HENT:     Head: Normocephalic and atraumatic.  Eyes:     General: No scleral icterus.    Extraocular Movements: Extraocular movements intact.     Conjunctiva/sclera: Conjunctivae normal.  Neck:     Musculoskeletal: Neck supple. No neck rigidity.     Vascular: Carotid bruit (Bilateral) present.  Cardiovascular:     Rate and Rhythm: Normal rate and regular rhythm.     Heart sounds: Normal heart sounds. No murmur. No friction rub. No gallop.   Pulmonary:     Effort: Pulmonary effort is normal. No respiratory distress.     Breath sounds: Normal breath sounds. No wheezing or rales.  Abdominal:     General: There is no distension.     Palpations: Abdomen is soft.     Tenderness: There is no abdominal tenderness.  Musculoskeletal:        General: No swelling.  Lymphadenopathy:     Cervical: No cervical adenopathy.  Skin:    General: Skin is warm and dry.  Neurological:     General: No focal deficit present.     Mental Status: She is alert and oriented to person, place, and time.     Cranial Nerves: No cranial nerve deficit.     Motor: No weakness.    Diagnostic Tests: CT CHEST WITHOUT CONTRAST LOW-DOSE FOR LUNG CANCER SCREENING  TECHNIQUE: Multidetector CT imaging of the chest was performed following the standard protocol without IV contrast.  COMPARISON:   None.  FINDINGS: Cardiovascular: Normal heart size. No significant pericardial effusion/thickening. Left anterior descending coronary atherosclerosis. Atherosclerotic nonaneurysmal thoracic aorta. Normal caliber pulmonary arteries.  Mediastinum/Nodes: No discrete thyroid nodules. Unremarkable esophagus. No axillary adenopathy. Mildly enlarged 1.0 cm right paratracheal node (series 2/image 22). No additional pathologically enlarged mediastinal nodes. No discrete hilar  adenopathy on this noncontrast scan.  Lungs/Pleura: No pneumothorax. No pleural effusion. No acute consolidative airspace disease. There are multiple pulmonary nodules scattered in both lungs, largest an irregular medial right upper lobe pulmonary nodule measuring 12.2 mm in volume derived mean diameter (series 4/image 83).  Upper abdomen: Cholelithiasis.  Musculoskeletal: No aggressive appearing focal osseous lesions. Mild thoracic spondylosis.  IMPRESSION: 1. Lung-RADS 4A, suspicious. Dominant irregular 12.2 mm medial right upper lobe pulmonary nodule. PET-CT may be considered for further evaluation. Follow up low-dose chest CT without contrast in 3 months (please use the following order, "CT CHEST LCS NODULE FOLLOW-UP W/O CM") is the alternative. 2. Mild right paratracheal lymphadenopathy, nonspecific, which could also be further evaluated with PET-CT or follow-up chest CT in 3 months. 3. One vessel coronary atherosclerosis. 4. Cholelithiasis.  Aortic Atherosclerosis (ICD10-I70.0).  These results will be called to the ordering clinician or representative by the Radiologist Assistant, and communication documented in the PACS or zVision Dashboard.   Electronically Signed   By: Ilona Sorrel M.D.   On: 07/25/2018 17:24 NUCLEAR MEDICINE PET SKULL BASE TO THIGH  TECHNIQUE: 0.1 mCi F-18 FDG was injected intravenously. Full-ring PET imaging was performed from the skull base to thigh after the  radiotracer. CT data was obtained and used for attenuation correction and anatomic localization.  Fasting blood glucose: 101 mg/dl  COMPARISON:  Chest CT from 07/25/2018  FINDINGS: Mediastinal blood pool activity: SUV max 2.1  Liver activity: SUV max NA  NECK: Mild symmetric glottic activity is likely physiologic. No significant abnormal hypermetabolic activity is identified.  Incidental CT findings: Common carotid atherosclerotic calcification noted.  CHEST: A spiculated 1.5 by 0.7 cm right upper lobe pulmonary nodule on image 77/3 has a maximum SUV of 2.3.  A part solid 0.6 cm right upper lobe pulmonary nodule on image 67/3 has a maximum SUV of 1.2.  Incidental CT findings: Atherosclerotic calcification of the aortic arch and branch vessels. Lower right paratracheal node 0.9 cm in short axis on image 89/3, maximum SUV 2.4.  ABDOMEN/PELVIS: No significant abnormal hypermetabolic activity in this region.  Incidental CT findings: Multiple gallstones noted in the gallbladder. Aortoiliac atherosclerotic vascular disease.  SKELETON: No significant abnormal hypermetabolic activity in this region.  Incidental CT findings: Chronic right maxillary sinusitis.  IMPRESSION: 1. The dominant spiculated right upper lobe nodule has maximum SUV of 2.3 which is minimally above blood pool. Given the morphology, the overall appearance is suspicious for low-grade malignancy. 2. There is an adjacent part solid 0.6 cm right upper lobe pulmonary nodule with maximum SUV of 1.2, nonspecific. 3. No hypermetabolic adenopathy. 4.  Aortic Atherosclerosis (ICD10-I70.0). 5. Cholelithiasis. 6. Chronic right maxillary sinusitis.   Electronically Signed   By: Van Clines M.D.   On: 07/30/2018 11:41 I personally reviewed the CT and PET/CT images and concur with the findings noted above  Impression: Cindy Butler is a 68 year old woman with a history of tobacco abuse  (50 pack years) and thrombocytopenia.  She recently was found to have thrombocytopenia on an annual checkup.  Because of her smoking history she was referred for a low-dose screening CT.  It showed 2 suspicious nodules in the right upper lobe.  On PET CT the dominant nodule is active with an SUV of 2.3 which is suspicious for a primary bronchogenic carcinoma.  Granulomatous disease is also in the differential, but less likely given her age and smoking history.  I reviewed the CT and PET/CT images with Cindy Butler.  We discussed the  differential diagnosis.  We discussed potential options for diagnosis and treatment of the nodules.  We discussed the relative advantages and disadvantages of surgical resection versus radiation therapy.  We discussed the limitations of CT guided and bronchoscopic biopsy given the central location of the nodule.  If either of those modalities showed a positive result surgery would be indicated.  If negative, unfortunately, it would not completely rule out the possibility of cancer.  Given that she is a good surgical candidate and the high probability that this is malignant, I recommended that we proceed directly to VATS for wedge resection and possible upper lobectomy.  I informed her of the general nature of the operation including the need for general anesthesia, the incision to be used, the use of drainage tube postoperatively, the expected hospital stay, and the overall recovery.  I informed her of the indications, risk, benefits, and alternatives.  She understands the risks include, but not limited to death, MI, DVT, PE, bleeding, possible need for transfusion, infection, prolonged air leak, cardiac arrhythmias, as well as the possibility of other unstable complications.  If she does not want to undergo surgery, stereotactic radiation would be an option.  If so, she would need a biopsy prior to treatment.  I did offer to arrange for her to talk with a radiation oncologist  before making any decisions.  She will think that over.  She would like to think about her options and discuss them with her family before making any decisions.  I encouraged her to do so  Thrombocytopenia-mild with baseline platelet count of 142,000.  Should not interfere with ability to have surgery.  Bilateral carotid bruits-we will obtain duplex  Plan: Patient will let us know if she would like to proceed with surgical resection, bronchoscopic biopsy, or referral to radiation oncology.  Melrose Nakayama, MD [facility] [phone]

## 2018-08-07 NOTE — Progress Notes (Signed)
PCP is Elsie Lincoln, MD Referring Provider is Derek Jack, MD  Chief Complaint  Patient presents with  . Lung Lesion    RULobe per LO DOSE CANCER SCREENING CHEST CT 07/25/18, PET 07/29/18    HPI: [name] is sent for consultation regarding right upper lobe lung nodules.  Cindy Butler is a 68 year old woman with a history of tobacco abuse, hyperlipidemia, reflux, osteoporosis, and recently diagnosed thrombocytopenia.  She recently had a annual physical.  Blood work showed thrombocytopenia.  She was referred to Dr. Delton Coombes.  She also was referred for low-dose CT screening due to her smoking history.  The LD CT showed a spiculated nodule in the right upper lobe measuring 1.2 cm.  There was a second more peripheral nodule approximately 6 mm in diameter.  A PET/CT showed the dominant nodule was hypermetabolic with an SUV of 2.3.  There also was some minimal uptake in the 6 mm nodule with an SUV of 1.2.  There was no significant adenopathy or evidence of metastatic disease.  She is still smoking.  She smoked about a pack a day for 50 years.  She has cut back a little.  She has been resistant to trying a patch.  She denies cough, hemoptysis, and wheezing.  She denies chest pain, pressure, tightness, or shortness of breath with exertion.  She can walk up a flight of stairs without stopping.  Her appetite is good.  She denies any weight loss.  Zubrod Score: At the time of surgery this patient's most appropriate activity status/level should be described as: [x]     0    Normal activity, no symptoms []     1    Restricted in physical strenuous activity but ambulatory, able to do out light work []     2    Ambulatory and capable of self care, unable to do work activities, up and about >50 % of waking hours                              []     3    Only limited self care, in bed greater than 50% of waking hours []     4    Completely disabled, no self care, confined to bed or chair []     5     Moribund  Past Medical History:  Diagnosis Date  . Esophageal reflux   . Hyperlipidemia   . Osteoporosis     Past Surgical History:  Procedure Laterality Date  . COLONOSCOPY  09/27/2010   Procedure: COLONOSCOPY;  Surgeon: Jamesetta So;  Location: [facility];  Service: Gastroenterology;  Laterality: N/A;  . NASAL SINUS SURGERY      Family History  Problem Relation Age of Onset  . Bone cancer Mother   . Alcohol abuse Father   . Melanoma Brother   . Breast cancer Maternal Grandmother     Social History Social History   Tobacco Use  . Smoking status: Current Every Day Smoker    Packs/day: 0.50    Years: 55.00    Pack years: 27.50    Types: Cigarettes  . Smokeless tobacco: Never Used  Substance Use Topics  . Alcohol use: Yes    Alcohol/week: 3.0 standard drinks    Types: 3 Standard drinks or equivalent per week  . Drug use: Never    Current Outpatient Medications  Medication Sig Dispense Refill  . aspirin EC 81 MG tablet Take 81  mg by mouth at bedtime.    . calcium-vitamin D 250-100 MG-UNIT tablet Take 1 tablet by mouth daily.    . ferrous sulfate 325 (65 FE) MG tablet Take 325 mg by mouth daily with breakfast.    . omeprazole (PRILOSEC) 40 MG capsule Take 40 mg by mouth daily.     . simvastatin (ZOCOR) 20 MG tablet Take 10 mg by mouth daily at 6 PM.     . vitamin B-12 (CYANOCOBALAMIN) 100 MCG tablet Take 1 mcg by mouth daily. Pt takes liquid form of Vitamin B-12 1 ml daily     No current facility-administered medications for this visit.     No Known Allergies  Review of Systems  Constitutional: Negative for activity change, appetite change and unexpected weight change.  HENT: Negative for trouble swallowing and voice change.   Eyes: Negative for visual disturbance.  Respiratory: Negative for cough, shortness of breath and wheezing.   Cardiovascular: Negative for chest pain, palpitations and leg swelling.  Genitourinary: Negative for difficulty urinating  and dysuria.  Musculoskeletal: Negative for arthralgias and back pain.  Neurological: Negative for syncope and weakness.  Hematological: Negative for adenopathy. Bruises/bleeds easily.  All other systems reviewed and are negative.   BP (!) 144/71 (BP Location: Left Arm, Patient Position: Sitting, Cuff Size: Normal)   Pulse 71   Temp 98.1 F (36.7 C) Comment: THERMAL  Resp 16   Ht 5\' 5"  (1.651 m)   Wt 170 lb (77.1 kg)   SpO2 96% Comment: RA  BMI 28.29 kg/m  Physical Exam Vitals signs reviewed.  Constitutional:      General: She is not in acute distress.    Appearance: Normal appearance.  HENT:     Head: Normocephalic and atraumatic.  Eyes:     General: No scleral icterus.    Extraocular Movements: Extraocular movements intact.     Conjunctiva/sclera: Conjunctivae normal.  Neck:     Musculoskeletal: Neck supple. No neck rigidity.     Vascular: Carotid bruit (Bilateral) present.  Cardiovascular:     Rate and Rhythm: Normal rate and regular rhythm.     Heart sounds: Normal heart sounds. No murmur. No friction rub. No gallop.   Pulmonary:     Effort: Pulmonary effort is normal. No respiratory distress.     Breath sounds: Normal breath sounds. No wheezing or rales.  Abdominal:     General: There is no distension.     Palpations: Abdomen is soft.     Tenderness: There is no abdominal tenderness.  Musculoskeletal:        General: No swelling.  Lymphadenopathy:     Cervical: No cervical adenopathy.  Skin:    General: Skin is warm and dry.  Neurological:     General: No focal deficit present.     Mental Status: She is alert and oriented to person, place, and time.     Cranial Nerves: No cranial nerve deficit.     Motor: No weakness.    Diagnostic Tests: CT CHEST WITHOUT CONTRAST LOW-DOSE FOR LUNG CANCER SCREENING  TECHNIQUE: Multidetector CT imaging of the chest was performed following the standard protocol without IV contrast.  COMPARISON:   None.  FINDINGS: Cardiovascular: Normal heart size. No significant pericardial effusion/thickening. Left anterior descending coronary atherosclerosis. Atherosclerotic nonaneurysmal thoracic aorta. Normal caliber pulmonary arteries.  Mediastinum/Nodes: No discrete thyroid nodules. Unremarkable esophagus. No axillary adenopathy. Mildly enlarged 1.0 cm right paratracheal node (series 2/image 22). No additional pathologically enlarged mediastinal nodes. No discrete hilar  adenopathy on this noncontrast scan.  Lungs/Pleura: No pneumothorax. No pleural effusion. No acute consolidative airspace disease. There are multiple pulmonary nodules scattered in both lungs, largest an irregular medial right upper lobe pulmonary nodule measuring 12.2 mm in volume derived mean diameter (series 4/image 83).  Upper abdomen: Cholelithiasis.  Musculoskeletal: No aggressive appearing focal osseous lesions. Mild thoracic spondylosis.  IMPRESSION: 1. Lung-RADS 4A, suspicious. Dominant irregular 12.2 mm medial right upper lobe pulmonary nodule. PET-CT may be considered for further evaluation. Follow up low-dose chest CT without contrast in 3 months (please use the following order, "CT CHEST LCS NODULE FOLLOW-UP W/O CM") is the alternative. 2. Mild right paratracheal lymphadenopathy, nonspecific, which could also be further evaluated with PET-CT or follow-up chest CT in 3 months. 3. One vessel coronary atherosclerosis. 4. Cholelithiasis.  Aortic Atherosclerosis (ICD10-I70.0).  These results will be called to the ordering clinician or representative by the Radiologist Assistant, and communication documented in the PACS or zVision Dashboard.   Electronically Signed   By: Ilona Sorrel M.D.   On: 07/25/2018 17:24 NUCLEAR MEDICINE PET SKULL BASE TO THIGH  TECHNIQUE: 0.1 mCi F-18 FDG was injected intravenously. Full-ring PET imaging was performed from the skull base to thigh after the  radiotracer. CT data was obtained and used for attenuation correction and anatomic localization.  Fasting blood glucose: 101 mg/dl  COMPARISON:  Chest CT from 07/25/2018  FINDINGS: Mediastinal blood pool activity: SUV max 2.1  Liver activity: SUV max NA  NECK: Mild symmetric glottic activity is likely physiologic. No significant abnormal hypermetabolic activity is identified.  Incidental CT findings: Common carotid atherosclerotic calcification noted.  CHEST: A spiculated 1.5 by 0.7 cm right upper lobe pulmonary nodule on image 77/3 has a maximum SUV of 2.3.  A part solid 0.6 cm right upper lobe pulmonary nodule on image 67/3 has a maximum SUV of 1.2.  Incidental CT findings: Atherosclerotic calcification of the aortic arch and branch vessels. Lower right paratracheal node 0.9 cm in short axis on image 89/3, maximum SUV 2.4.  ABDOMEN/PELVIS: No significant abnormal hypermetabolic activity in this region.  Incidental CT findings: Multiple gallstones noted in the gallbladder. Aortoiliac atherosclerotic vascular disease.  SKELETON: No significant abnormal hypermetabolic activity in this region.  Incidental CT findings: Chronic right maxillary sinusitis.  IMPRESSION: 1. The dominant spiculated right upper lobe nodule has maximum SUV of 2.3 which is minimally above blood pool. Given the morphology, the overall appearance is suspicious for low-grade malignancy. 2. There is an adjacent part solid 0.6 cm right upper lobe pulmonary nodule with maximum SUV of 1.2, nonspecific. 3. No hypermetabolic adenopathy. 4.  Aortic Atherosclerosis (ICD10-I70.0). 5. Cholelithiasis. 6. Chronic right maxillary sinusitis.   Electronically Signed   By: Van Clines M.D.   On: 07/30/2018 11:41 I personally reviewed the CT and PET/CT images and concur with the findings noted above  Impression: Cindy Butler is a 68 year old woman with a history of tobacco abuse  (50 pack years) and thrombocytopenia.  She recently was found to have thrombocytopenia on an annual checkup.  Because of her smoking history she was referred for a low-dose screening CT.  It showed 2 suspicious nodules in the right upper lobe.  On PET CT the dominant nodule is active with an SUV of 2.3 which is suspicious for a primary bronchogenic carcinoma.  Granulomatous disease is also in the differential, but less likely given her age and smoking history.  I reviewed the CT and PET/CT images with Mrs. Costales.  We discussed the  differential diagnosis.  We discussed potential options for diagnosis and treatment of the nodules.  We discussed the relative advantages and disadvantages of surgical resection versus radiation therapy.  We discussed the limitations of CT guided and bronchoscopic biopsy given the central location of the nodule.  If either of those modalities showed a positive result surgery would be indicated.  If negative, unfortunately, it would not completely rule out the possibility of cancer.  Given that she is a good surgical candidate and the high probability that this is malignant, I recommended that we proceed directly to VATS for wedge resection and possible upper lobectomy.  I informed her of the general nature of the operation including the need for general anesthesia, the incision to be used, the use of drainage tube postoperatively, the expected hospital stay, and the overall recovery.  I informed her of the indications, risk, benefits, and alternatives.  She understands the risks include, but not limited to death, MI, DVT, PE, bleeding, possible need for transfusion, infection, prolonged air leak, cardiac arrhythmias, as well as the possibility of other unstable complications.  If she does not want to undergo surgery, stereotactic radiation would be an option.  If so, she would need a biopsy prior to treatment.  I did offer to arrange for her to talk with a radiation oncologist  before making any decisions.  She will think that over.  She would like to think about her options and discuss them with her family before making any decisions.  I encouraged her to do so  Thrombocytopenia-mild with baseline platelet count of 142,000.  Should not interfere with ability to have surgery.  Bilateral carotid bruits-we will obtain duplex  Plan: Patient will let us know if she would like to proceed with surgical resection, bronchoscopic biopsy, or referral to radiation oncology.  Melrose Nakayama, MD [facility] [phone]

## 2018-08-08 ENCOUNTER — Other Ambulatory Visit: Payer: Self-pay | Admitting: *Deleted

## 2018-08-08 DIAGNOSIS — R918 Other nonspecific abnormal finding of lung field: Secondary | ICD-10-CM

## 2018-08-09 ENCOUNTER — Other Ambulatory Visit (HOSPITAL_COMMUNITY): Payer: Medicare Other

## 2018-08-12 ENCOUNTER — Other Ambulatory Visit: Payer: Self-pay

## 2018-08-12 ENCOUNTER — Ambulatory Visit (HOSPITAL_COMMUNITY)
Admission: RE | Admit: 2018-08-12 | Discharge: 2018-08-12 | Disposition: A | Discharge status: Home / Self Care | Payer: Medicare Other | Source: Ambulatory Visit | Attending: Thoracic Surgery (Cardiothoracic Vascular Surgery) | Admitting: Thoracic Surgery (Cardiothoracic Vascular Surgery)

## 2018-08-12 DIAGNOSIS — R911 Solitary pulmonary nodule: Secondary | ICD-10-CM | POA: Diagnosis not present

## 2018-08-12 DIAGNOSIS — I6523 Occlusion and stenosis of bilateral carotid arteries: Secondary | ICD-10-CM | POA: Diagnosis not present

## 2018-08-16 ENCOUNTER — Other Ambulatory Visit: Payer: Self-pay

## 2018-08-16 ENCOUNTER — Other Ambulatory Visit (HOSPITAL_COMMUNITY)
Admission: RE | Admit: 2018-08-16 | Discharge: 2018-08-16 | Disposition: A | Discharge status: Home / Self Care | Payer: Medicare Other | Source: Ambulatory Visit | Attending: Thoracic Surgery (Cardiothoracic Vascular Surgery) | Admitting: Thoracic Surgery (Cardiothoracic Vascular Surgery)

## 2018-08-16 DIAGNOSIS — Z20828 Contact with and (suspected) exposure to other viral communicable diseases: Secondary | ICD-10-CM | POA: Insufficient documentation

## 2018-08-17 LAB — SARS CORONAVIRUS 2 (TAT 6-24 HRS): SARS Coronavirus 2: NEGATIVE

## 2018-08-19 NOTE — Pre-Procedure Instructions (Addendum)
[place]  08/19/2018      [facility] - [place], [place] [address] Phone: [phone] Fax: [phone] - [place], [place] [address] Phone: [phone] Fax: [phone]    Your procedure is scheduled on August 23, 2018.  Report to [facility] Admitting at 530 AM.  Call this number if you have problems the morning of surgery:  [phone]   Call [phone] if you have any questions prior to your surgery date Monday-Friday 8am-4pm    Remember:  Do not eat or drink after midnight.    Take these medicines the morning of surgery with A SIP OF WATER  Omeprazole (prilosec)  Follow your surgeon's instructions on when to hold/resume aspirin.  If no instructions were given call the office to determine how they would like to you take aspirin  Beginning now, STOP taking any Aleve, Naproxen, Ibuprofen, Motrin, Advil, Goody's, BC's, all herbal medications, fish oil, and all vitamins    Day of surgery:  Do not wear jewelry, make-up or nail polish.  Do not wear lotions, powders, or perfumes, or deodorant.  Do not shave 48 hours prior to surgery.    Do not bring valuables to the hospital.   [facility] is not responsible for any belongings or valuables. \IF you are a smoker, DO NOT Smoke 24 hours prior to surgery   IF you wear a CPAP at night please bring your mask, tubing, and machine the morning of surgery    Remember that you must have someone to transport you home after your surgery, and remain with you for 24 hours if you are discharged the same day.  Contacts, dentures or bridgework may not be worn into surgery.  Leave your suitcase in the car.  After surgery it may be brought to your room.  For patients admitted to the hospital, discharge time will be determined by your treatment team.  Patients discharged the day of  surgery will not be allowed to drive home.    [place]- Preparing For Surgery  Before surgery, you can play an important role. Because skin is not sterile, your skin needs to be as free of germs as possible. You can reduce the number of germs on your skin by washing with CHG (chlorahexidine gluconate) Soap before surgery.  CHG is an antiseptic cleaner which kills germs and bonds with the skin to continue killing germs even after washing.    Oral Hygiene is also important to reduce your risk of infection.  Remember - BRUSH YOUR TEETH THE MORNING OF SURGERY WITH YOUR REGULAR TOOTHPASTE  Please do not use if you have an allergy to CHG or antibacterial soaps. If your skin becomes reddened/irritated stop using the CHG.  Do not shave (including legs and underarms) for at least 48 hours prior to first CHG shower. It is OK to shave your face.  Please follow these instructions carefully.   1. Shower the NIGHT BEFORE SURGERY and the MORNING OF SURGERY with CHG.   2. If you chose to wash your hair, wash your hair first as usual with your normal shampoo.  3. After you shampoo, rinse your hair and body thoroughly to remove the shampoo.  4. Use CHG as you would any other liquid soap. You can apply CHG directly to the skin and wash gently with a scrungie or a  clean washcloth.   5. Apply the CHG Soap to your body ONLY FROM THE NECK DOWN.  Do not use on open wounds or open sores. Avoid contact with your eyes, ears, mouth and genitals (private parts). Wash Face and genitals (private parts)  with your normal soap.  6. Wash thoroughly, paying special attention to the area where your surgery will be performed.  7. Thoroughly rinse your body with warm water from the neck down.  8. DO NOT shower/wash with your normal soap after using and rinsing off the CHG Soap.  9. Pat yourself dry with a CLEAN TOWEL.  10. Wear CLEAN PAJAMAS to bed the night before surgery, wear comfortable clothes the morning of  surgery  11. Place CLEAN SHEETS on your bed the night of your first shower and DO NOT SLEEP WITH PETS.  Day of Surgery: Shower as above Do not apply any deodorants/lotions.  Please wear clean clothes to the hospital/surgery center.   Remember to brush your teeth WITH YOUR REGULAR TOOTHPASTE.  Please read over the following fact sheets that you were given.

## 2018-08-20 ENCOUNTER — Other Ambulatory Visit: Payer: Self-pay

## 2018-08-20 ENCOUNTER — Encounter (HOSPITAL_COMMUNITY): Payer: Self-pay

## 2018-08-20 ENCOUNTER — Encounter (HOSPITAL_COMMUNITY)
Admission: RE | Admit: 2018-08-20 | Discharge: 2018-08-20 | Disposition: A | Discharge status: Home / Self Care | Payer: Medicare Other | Source: Ambulatory Visit | Attending: Thoracic Surgery (Cardiothoracic Vascular Surgery) | Admitting: Thoracic Surgery (Cardiothoracic Vascular Surgery)

## 2018-08-20 ENCOUNTER — Ambulatory Visit (HOSPITAL_COMMUNITY)
Admission: RE | Admit: 2018-08-20 | Discharge: 2018-08-20 | Disposition: A | Discharge status: Home / Self Care | Payer: Medicare Other | Source: Ambulatory Visit | Attending: Thoracic Surgery (Cardiothoracic Vascular Surgery) | Admitting: Thoracic Surgery (Cardiothoracic Vascular Surgery)

## 2018-08-20 ENCOUNTER — Other Ambulatory Visit (HOSPITAL_COMMUNITY)
Admission: RE | Admit: 2018-08-20 | Discharge: 2018-08-20 | Disposition: A | Discharge status: Home / Self Care | Payer: Medicare Other | Source: Ambulatory Visit | Attending: Thoracic Surgery (Cardiothoracic Vascular Surgery) | Admitting: Thoracic Surgery (Cardiothoracic Vascular Surgery)

## 2018-08-20 DIAGNOSIS — Z20828 Contact with and (suspected) exposure to other viral communicable diseases: Secondary | ICD-10-CM | POA: Insufficient documentation

## 2018-08-20 DIAGNOSIS — I97191 Other postprocedural cardiac functional disturbances following other surgery: Secondary | ICD-10-CM | POA: Diagnosis not present

## 2018-08-20 DIAGNOSIS — J449 Chronic obstructive pulmonary disease, unspecified: Secondary | ICD-10-CM | POA: Diagnosis not present

## 2018-08-20 DIAGNOSIS — J95811 Postprocedural pneumothorax: Secondary | ICD-10-CM | POA: Diagnosis not present

## 2018-08-20 DIAGNOSIS — R911 Solitary pulmonary nodule: Secondary | ICD-10-CM | POA: Diagnosis not present

## 2018-08-20 DIAGNOSIS — I700 Atherosclerosis of aorta: Secondary | ICD-10-CM | POA: Diagnosis not present

## 2018-08-20 DIAGNOSIS — D696 Thrombocytopenia, unspecified: Secondary | ICD-10-CM | POA: Diagnosis not present

## 2018-08-20 DIAGNOSIS — R918 Other nonspecific abnormal finding of lung field: Secondary | ICD-10-CM

## 2018-08-20 DIAGNOSIS — C3411 Malignant neoplasm of upper lobe, right bronchus or lung: Secondary | ICD-10-CM | POA: Diagnosis not present

## 2018-08-20 DIAGNOSIS — J9811 Atelectasis: Secondary | ICD-10-CM | POA: Diagnosis not present

## 2018-08-20 DIAGNOSIS — D62 Acute posthemorrhagic anemia: Secondary | ICD-10-CM | POA: Diagnosis not present

## 2018-08-20 DIAGNOSIS — I480 Paroxysmal atrial fibrillation: Secondary | ICD-10-CM | POA: Diagnosis not present

## 2018-08-20 DIAGNOSIS — I2510 Atherosclerotic heart disease of native coronary artery without angina pectoris: Secondary | ICD-10-CM | POA: Diagnosis not present

## 2018-08-20 DIAGNOSIS — Z01812 Encounter for preprocedural laboratory examination: Secondary | ICD-10-CM | POA: Insufficient documentation

## 2018-08-20 DIAGNOSIS — E785 Hyperlipidemia, unspecified: Secondary | ICD-10-CM | POA: Diagnosis not present

## 2018-08-20 LAB — SURGICAL PCR SCREEN
MRSA, PCR: NEGATIVE
Staphylococcus aureus: NEGATIVE

## 2018-08-20 LAB — COMPREHENSIVE METABOLIC PANEL
ALT: 44 U/L (ref 0–44)
AST: 56 U/L — ABNORMAL HIGH (ref 15–41)
Albumin: 4 g/dL (ref 3.5–5.0)
Alkaline Phosphatase: 85 U/L (ref 38–126)
Anion gap: 10 (ref 5–15)
BUN: 11 mg/dL (ref 8–23)
CO2: 21 mmol/L — ABNORMAL LOW (ref 22–32)
Calcium: 9.5 mg/dL (ref 8.9–10.3)
Chloride: 110 mmol/L (ref 98–111)
Creatinine, Ser: 0.99 mg/dL (ref 0.44–1.00)
GFR calc Af Amer: >60 mL/min (ref >60)
GFR calc non Af Amer: 59 mL/min — ABNORMAL LOW (ref >60)
Glucose, Bld: 106 mg/dL — ABNORMAL HIGH (ref 70–99)
Potassium: 4.3 mmol/L (ref 3.5–5.1)
Sodium: 141 mmol/L (ref 135–145)
Total Bilirubin: 0.5 mg/dL (ref 0.3–1.2)
Total Protein: 6.7 g/dL (ref 6.5–8.1)

## 2018-08-20 LAB — URINALYSIS, ROUTINE W REFLEX MICROSCOPIC
Bacteria, UA: NONE SEEN
Bilirubin Urine: NEGATIVE
Glucose, UA: NEGATIVE mg/dL
Ketones, ur: NEGATIVE mg/dL
Leukocytes,Ua: NEGATIVE
Nitrite: NEGATIVE
Protein, ur: NEGATIVE mg/dL
Specific Gravity, Urine: 1.003 — ABNORMAL LOW (ref 1.005–1.030)
pH: 7 (ref 5.0–8.0)

## 2018-08-20 LAB — CBC
HCT: 39.5 % (ref 36.0–46.0)
Hemoglobin: 13.2 g/dL (ref 12.0–15.0)
MCH: 32.3 pg (ref 26.0–34.0)
MCHC: 33.4 g/dL (ref 30.0–36.0)
MCV: 96.6 fL (ref 80.0–100.0)
Platelets: 134 10*3/uL — ABNORMAL LOW (ref 150–400)
RBC: 4.09 MIL/uL (ref 3.87–5.11)
RDW: 12.7 % (ref 11.5–15.5)
WBC: 3.4 10*3/uL — ABNORMAL LOW (ref 4.0–10.5)
nRBC: 0 % (ref 0.0–0.2)

## 2018-08-20 LAB — BLOOD GAS, ARTERIAL
Acid-base deficit: 0.3 mmol/L (ref 0.0–2.0)
Bicarbonate: 23.6 mmol/L (ref 20.0–28.0)
Drawn by: 42180
FIO2: 21
O2 Saturation: 97.1 %
Patient temperature: 98.6
pCO2 arterial: 36.8 mmHg (ref 32.0–48.0)
pH, Arterial: 7.423 (ref 7.350–7.450)
pO2, Arterial: 91.2 mmHg (ref 83.0–108.0)

## 2018-08-20 LAB — PROTIME-INR
INR: 1 (ref 0.8–1.2)
Prothrombin Time: 12.6 seconds (ref 11.4–15.2)

## 2018-08-20 LAB — TYPE AND SCREEN
ABO/RH(D): O NEG
Antibody Screen: NEGATIVE

## 2018-08-20 LAB — APTT: aPTT: 34 seconds (ref 24–36)

## 2018-08-20 LAB — ABO/RH: ABO/RH(D): O NEG

## 2018-08-20 LAB — SARS CORONAVIRUS 2 (TAT 6-24 HRS): SARS Coronavirus 2: NEGATIVE

## 2018-08-20 NOTE — Progress Notes (Addendum)
PCP -  [facility] Cardiologist - pt denies  Chest x-ray - 08/20/2018 at pat appt EKG - 08/20/2018 at pat appt  Stress Test - pt denies ECHO - pt denies  Cardiac Cath - pt denies  Sleep Study -  Pt denies CPAP - n/a  Fasting Blood Sugar - n/a Checks Blood Sugar _____ times a day- n/a  Blood Thinner Instructions: pt denies Aspirin Instructions: Aspirin 81 mg -holding 08/20/2018  Anesthesia review: Yes, low platlets   Patient denies shortness of breath, fever, cough and chest pain at PAT appointment  Patient verbalized understanding of instructions that were given to them at the PAT appointment. Patient was also instructed that they will need to review over the PAT instructions again at home before surgery.   Coronavirus Screening  Have you experienced the following symptoms:  Cough yes/no: No Fever (>100.50F)  yes/no: No Runny nose yes/no: No Sore throat yes/no: No Difficulty breathing/shortness of breath  yes/no: No  Have you or a family member traveled in the last 14 days and where? yes/no: No   If the patient indicates "YES" to the above questions, their PAT will be rescheduled to limit the exposure to others and, the surgeon will be notified. THE PATIENT WILL NEED TO BE ASYMPTOMATIC FOR 14 DAYS.   If the patient is not experiencing any of these symptoms, the PAT nurse will instruct them to NOT bring anyone with them to their appointment since they may have these symptoms or traveled as well.   Please remind your patients and families that hospital visitation restrictions are in effect and the importance of the restrictions.

## 2018-08-21 ENCOUNTER — Other Ambulatory Visit: Payer: Self-pay

## 2018-08-21 ENCOUNTER — Ambulatory Visit (HOSPITAL_COMMUNITY)
Admission: RE | Admit: 2018-08-21 | Discharge: 2018-08-21 | Disposition: A | Discharge status: Home / Self Care | Payer: Medicare Other | Source: Ambulatory Visit | Attending: Thoracic Surgery (Cardiothoracic Vascular Surgery) | Admitting: Thoracic Surgery (Cardiothoracic Vascular Surgery)

## 2018-08-21 DIAGNOSIS — R911 Solitary pulmonary nodule: Secondary | ICD-10-CM | POA: Insufficient documentation

## 2018-08-21 LAB — PULMONARY FUNCTION TEST
DL/VA % pred: 92 %
DL/VA: 3.8 ml/min/mmHg/L
DLCO cor % pred: 83 %
DLCO cor: 17.01 ml/min/mmHg
DLCO unc % pred: 83 %
DLCO unc: 16.9 ml/min/mmHg
FEF 25-75 Post: 2.38 L/sec
FEF 25-75 Pre: 2.51 L/sec
FEF2575-%Change-Post: -4 %
FEF2575-%Pred-Post: 116 %
FEF2575-%Pred-Pre: 122 %
FEV1-%Change-Post: 0 %
FEV1-%Pred-Post: 108 %
FEV1-%Pred-Pre: 109 %
FEV1-Post: 2.63 L
FEV1-Pre: 2.64 L
FEV1FVC-%Change-Post: 3 %
FEV1FVC-%Pred-Pre: 102 %
FEV6-%Change-Post: -4 %
FEV6-%Pred-Post: 105 %
FEV6-%Pred-Pre: 110 %
FEV6-Post: 3.23 L
FEV6-Pre: 3.36 L
FEV6FVC-%Pred-Post: 104 %
FEV6FVC-%Pred-Pre: 104 %
FVC-%Change-Post: -4 %
FVC-%Pred-Post: 101 %
FVC-%Pred-Pre: 105 %
FVC-Post: 3.23 L
FVC-Pre: 3.36 L
Post FEV1/FVC ratio: 81 %
Post FEV6/FVC ratio: 100 %
Pre FEV1/FVC ratio: 79 %
Pre FEV6/FVC Ratio: 100 %

## 2018-08-21 MED ORDER — ALBUTEROL SULFATE (2.5 MG/3ML) 0.083% IN NEBU
2.5000 mg | INHALATION_SOLUTION | Freq: Once | RESPIRATORY_TRACT | Status: AC
  Administered 2018-08-21: 08:00:00 2.5 mg via RESPIRATORY_TRACT

## 2018-08-22 ENCOUNTER — Encounter (HOSPITAL_COMMUNITY): Payer: Self-pay | Admitting: Certified Registered"

## 2018-08-22 NOTE — Anesthesia Preprocedure Evaluation (Addendum)
Anesthesia Evaluation  Patient identified by MRN, date of birth, ID band Patient awake    Reviewed: Allergy & Precautions, NPO status , Patient's Chart, lab work & pertinent test results  History of Anesthesia Complications Negative for: history of anesthetic complications  Airway Mallampati: II  TM Distance: >3 FB Neck ROM: Full    Dental  (+) Edentulous Upper, Edentulous Lower   Pulmonary Current Smoker and Patient abstained from smoking., former smoker,   Lung nodules    Pulmonary exam normal        Cardiovascular (-) anginaNormal cardiovascular exam   '20 Carotid US - Moderate to large amount of bilateral atherosclerotic plaque, right greater than left, likely progressed compared to the 05/2017 examination, though not resulting in a hemodynamically significant stenosis within either internal carotid artery.    Neuro/Psych negative neurological ROS  negative psych ROS   GI/Hepatic Neg liver ROS, GERD  Medicated and Controlled,  Endo/Other  negative endocrine ROS  Renal/GU negative Renal ROS     Musculoskeletal negative musculoskeletal ROS (+)   Abdominal   Peds  Hematology  Thrombocytopenia    Anesthesia Other Findings   Reproductive/Obstetrics                           Anesthesia Physical Anesthesia Plan  ASA: II  Anesthesia Plan: General   Post-op Pain Management:    Induction: Intravenous  PONV Risk Score and Plan: 3 and Treatment may vary due to age or medical condition, Ondansetron and Dexamethasone  Airway Management Planned: Double Lumen EBT  Additional Equipment: Arterial line  Intra-op Plan:   Post-operative Plan: Possible Post-op intubation/ventilation  Informed Consent: I have reviewed the patients History and Physical, chart, labs and discussed the procedure including the risks, benefits and alternatives for the proposed anesthesia with the patient or  authorized representative who has indicated his/her understanding and acceptance.     Dental advisory given  Plan Discussed with: CRNA and Anesthesiologist  Anesthesia Plan Comments: ( 2 large bore peripheral IVs)      Anesthesia Quick Evaluation

## 2018-08-23 ENCOUNTER — Inpatient Hospital Stay (HOSPITAL_COMMUNITY): Payer: Medicare Other | Admitting: Certified Registered"

## 2018-08-23 ENCOUNTER — Inpatient Hospital Stay (HOSPITAL_COMMUNITY): Payer: Medicare Other | Admitting: Physician Assistant

## 2018-08-23 ENCOUNTER — Encounter (HOSPITAL_COMMUNITY): Payer: Self-pay

## 2018-08-23 ENCOUNTER — Encounter (HOSPITAL_COMMUNITY)
Admission: RE | Disposition: A | Discharge status: Home / Self Care | Payer: Self-pay | Source: Home / Self Care | Attending: Thoracic Surgery (Cardiothoracic Vascular Surgery)

## 2018-08-23 ENCOUNTER — Inpatient Hospital Stay (HOSPITAL_COMMUNITY): Payer: Medicare Other

## 2018-08-23 ENCOUNTER — Inpatient Hospital Stay (HOSPITAL_COMMUNITY)
Admission: RE | Admit: 2018-08-23 | Discharge: 2018-08-30 | DRG: 164 | Disposition: A | Discharge status: Home / Self Care | Payer: Medicare Other | Attending: Thoracic Surgery (Cardiothoracic Vascular Surgery) | Admitting: Thoracic Surgery (Cardiothoracic Vascular Surgery)

## 2018-08-23 ENCOUNTER — Other Ambulatory Visit: Payer: Self-pay

## 2018-08-23 DIAGNOSIS — J9811 Atelectasis: Secondary | ICD-10-CM | POA: Diagnosis not present

## 2018-08-23 DIAGNOSIS — Z902 Acquired absence of lung [part of]: Secondary | ICD-10-CM

## 2018-08-23 DIAGNOSIS — M810 Age-related osteoporosis without current pathological fracture: Secondary | ICD-10-CM | POA: Diagnosis present

## 2018-08-23 DIAGNOSIS — F17210 Nicotine dependence, cigarettes, uncomplicated: Secondary | ICD-10-CM | POA: Diagnosis present

## 2018-08-23 DIAGNOSIS — R918 Other nonspecific abnormal finding of lung field: Secondary | ICD-10-CM

## 2018-08-23 DIAGNOSIS — D696 Thrombocytopenia, unspecified: Secondary | ICD-10-CM | POA: Diagnosis present

## 2018-08-23 DIAGNOSIS — J939 Pneumothorax, unspecified: Secondary | ICD-10-CM

## 2018-08-23 DIAGNOSIS — I480 Paroxysmal atrial fibrillation: Secondary | ICD-10-CM | POA: Diagnosis not present

## 2018-08-23 DIAGNOSIS — Z20828 Contact with and (suspected) exposure to other viral communicable diseases: Secondary | ICD-10-CM | POA: Diagnosis not present

## 2018-08-23 DIAGNOSIS — J449 Chronic obstructive pulmonary disease, unspecified: Secondary | ICD-10-CM | POA: Diagnosis present

## 2018-08-23 DIAGNOSIS — Z803 Family history of malignant neoplasm of breast: Secondary | ICD-10-CM | POA: Diagnosis not present

## 2018-08-23 DIAGNOSIS — Z7982 Long term (current) use of aspirin: Secondary | ICD-10-CM | POA: Diagnosis not present

## 2018-08-23 DIAGNOSIS — Z4682 Encounter for fitting and adjustment of non-vascular catheter: Secondary | ICD-10-CM | POA: Diagnosis not present

## 2018-08-23 DIAGNOSIS — I2510 Atherosclerotic heart disease of native coronary artery without angina pectoris: Secondary | ICD-10-CM | POA: Diagnosis present

## 2018-08-23 DIAGNOSIS — J95811 Postprocedural pneumothorax: Secondary | ICD-10-CM | POA: Diagnosis not present

## 2018-08-23 DIAGNOSIS — Z79899 Other long term (current) drug therapy: Secondary | ICD-10-CM

## 2018-08-23 DIAGNOSIS — I4891 Unspecified atrial fibrillation: Secondary | ICD-10-CM | POA: Diagnosis not present

## 2018-08-23 DIAGNOSIS — Y836 Removal of other organ (partial) (total) as the cause of abnormal reaction of the patient, or of later complication, without mention of misadventure at the time of the procedure: Secondary | ICD-10-CM | POA: Diagnosis not present

## 2018-08-23 DIAGNOSIS — J60 Coalworker's pneumoconiosis: Secondary | ICD-10-CM | POA: Diagnosis not present

## 2018-08-23 DIAGNOSIS — E785 Hyperlipidemia, unspecified: Secondary | ICD-10-CM | POA: Diagnosis not present

## 2018-08-23 DIAGNOSIS — Z978 Presence of other specified devices: Secondary | ICD-10-CM | POA: Diagnosis not present

## 2018-08-23 DIAGNOSIS — K219 Gastro-esophageal reflux disease without esophagitis: Secondary | ICD-10-CM | POA: Diagnosis present

## 2018-08-23 DIAGNOSIS — I97191 Other postprocedural cardiac functional disturbances following other surgery: Secondary | ICD-10-CM | POA: Diagnosis not present

## 2018-08-23 DIAGNOSIS — I700 Atherosclerosis of aorta: Secondary | ICD-10-CM | POA: Diagnosis not present

## 2018-08-23 DIAGNOSIS — Z808 Family history of malignant neoplasm of other organs or systems: Secondary | ICD-10-CM

## 2018-08-23 DIAGNOSIS — J982 Interstitial emphysema: Secondary | ICD-10-CM | POA: Diagnosis not present

## 2018-08-23 DIAGNOSIS — Z720 Tobacco use: Secondary | ICD-10-CM | POA: Diagnosis not present

## 2018-08-23 DIAGNOSIS — Y92230 Patient room in hospital as the place of occurrence of the external cause: Secondary | ICD-10-CM | POA: Diagnosis not present

## 2018-08-23 DIAGNOSIS — R911 Solitary pulmonary nodule: Secondary | ICD-10-CM | POA: Diagnosis present

## 2018-08-23 DIAGNOSIS — D62 Acute posthemorrhagic anemia: Secondary | ICD-10-CM | POA: Diagnosis not present

## 2018-08-23 DIAGNOSIS — R0602 Shortness of breath: Secondary | ICD-10-CM | POA: Diagnosis not present

## 2018-08-23 DIAGNOSIS — C3411 Malignant neoplasm of upper lobe, right bronchus or lung: Principal | ICD-10-CM | POA: Diagnosis present

## 2018-08-23 HISTORY — PX: VIDEO ASSISTED THORACOSCOPY (VATS)/ LOBECTOMY: SHX6169

## 2018-08-23 LAB — GLUCOSE, CAPILLARY: Glucose-Capillary: 138 mg/dL — ABNORMAL HIGH (ref 70–99)

## 2018-08-23 SURGERY — VIDEO ASSISTED THORACOSCOPY (VATS)/ LOBECTOMY
Anesthesia: General | Site: Chest | Laterality: Right

## 2018-08-23 MED ORDER — PANTOPRAZOLE SODIUM 40 MG PO TBEC
40.0000 mg | DELAYED_RELEASE_TABLET | Freq: Every day | ORAL | Status: DC
  Administered 2018-08-24 – 2018-08-30 (×7): 40 mg via ORAL
  Filled 2018-08-23 (×7): qty 1

## 2018-08-23 MED ORDER — SODIUM CHLORIDE 0.9 % IV SOLN
INTRAVENOUS | Status: DC | PRN
  Administered 2018-08-23: 30 ug/min via INTRAVENOUS
  Administered 2018-08-23: 11:00:00 via INTRAVENOUS

## 2018-08-23 MED ORDER — BISACODYL 5 MG PO TBEC
10.0000 mg | DELAYED_RELEASE_TABLET | Freq: Every day | ORAL | Status: DC
  Administered 2018-08-24 – 2018-08-26 (×3): 10 mg via ORAL
  Filled 2018-08-23 (×5): qty 2

## 2018-08-23 MED ORDER — PROPOFOL 10 MG/ML IV BOLUS
INTRAVENOUS | Status: DC | PRN
  Administered 2018-08-23: 150 mg via INTRAVENOUS

## 2018-08-23 MED ORDER — MIDAZOLAM HCL 5 MG/5ML IJ SOLN
INTRAMUSCULAR | Status: DC | PRN
  Administered 2018-08-23: 2 mg via INTRAVENOUS

## 2018-08-23 MED ORDER — ROCURONIUM BROMIDE 10 MG/ML (PF) SYRINGE
PREFILLED_SYRINGE | INTRAVENOUS | Status: DC | PRN
  Administered 2018-08-23: 10 mg via INTRAVENOUS
  Administered 2018-08-23: 20 mg via INTRAVENOUS
  Administered 2018-08-23 (×2): 10 mg via INTRAVENOUS
  Administered 2018-08-23: 50 mg via INTRAVENOUS

## 2018-08-23 MED ORDER — SENNOSIDES-DOCUSATE SODIUM 8.6-50 MG PO TABS
1.0000 | ORAL_TABLET | Freq: Every day | ORAL | Status: DC
  Administered 2018-08-23 – 2018-08-28 (×6): 1 via ORAL
  Filled 2018-08-23 (×7): qty 1

## 2018-08-23 MED ORDER — BUPIVACAINE LIPOSOME 1.3 % IJ SUSP
20.0000 mL | INTRAMUSCULAR | Status: DC
  Filled 2018-08-23: qty 20

## 2018-08-23 MED ORDER — LIDOCAINE 2% (20 MG/ML) 5 ML SYRINGE
INTRAMUSCULAR | Status: DC | PRN
  Administered 2018-08-23: 60 mg via INTRAVENOUS

## 2018-08-23 MED ORDER — SUGAMMADEX SODIUM 200 MG/2ML IV SOLN
INTRAVENOUS | Status: DC | PRN
  Administered 2018-08-23: 160 mg via INTRAVENOUS

## 2018-08-23 MED ORDER — ALBUMIN HUMAN 5 % IV SOLN
INTRAVENOUS | Status: AC
  Filled 2018-08-23: qty 250

## 2018-08-23 MED ORDER — CEFAZOLIN SODIUM-DEXTROSE 2-4 GM/100ML-% IV SOLN
2.0000 g | Freq: Three times a day (TID) | INTRAVENOUS | Status: AC
  Administered 2018-08-23 – 2018-08-24 (×2): 2 g via INTRAVENOUS
  Filled 2018-08-23 (×2): qty 100

## 2018-08-23 MED ORDER — ENOXAPARIN SODIUM 40 MG/0.4ML ~~LOC~~ SOLN
40.0000 mg | SUBCUTANEOUS | Status: DC
  Administered 2018-08-24 – 2018-08-29 (×6): 40 mg via SUBCUTANEOUS
  Filled 2018-08-23 (×7): qty 0.4

## 2018-08-23 MED ORDER — SODIUM CHLORIDE 0.9% FLUSH: 9.0000 mL | INTRAVENOUS | Status: DC | PRN

## 2018-08-23 MED ORDER — DIPHENHYDRAMINE HCL 50 MG/ML IJ SOLN: 12.5000 mg | Freq: Four times a day (QID) | INTRAMUSCULAR | Status: DC | PRN

## 2018-08-23 MED ORDER — ONDANSETRON HCL 4 MG/2ML IJ SOLN
INTRAMUSCULAR | Status: DC | PRN
  Administered 2018-08-23: 4 mg via INTRAVENOUS

## 2018-08-23 MED ORDER — ACETAMINOPHEN 160 MG/5ML PO SOLN: 1000.0000 mg | Freq: Four times a day (QID) | ORAL | Status: AC

## 2018-08-23 MED ORDER — BUPIVACAINE HCL (PF) 0.5 % IJ SOLN
INTRAMUSCULAR | Status: AC
  Filled 2018-08-23: qty 30

## 2018-08-23 MED ORDER — OXYCODONE HCL 5 MG PO TABS: 5.0000 mg | ORAL_TABLET | Freq: Once | ORAL | Status: DC | PRN

## 2018-08-23 MED ORDER — BUPIVACAINE LIPOSOME 1.3 % IJ SUSP
INTRAMUSCULAR | Status: DC | PRN
  Administered 2018-08-23: 09:00:00 50 mL

## 2018-08-23 MED ORDER — ACETAMINOPHEN 500 MG PO TABS
1000.0000 mg | ORAL_TABLET | Freq: Four times a day (QID) | ORAL | Status: AC
  Administered 2018-08-23 – 2018-08-28 (×18): 1000 mg via ORAL
  Filled 2018-08-23 (×18): qty 2

## 2018-08-23 MED ORDER — FENTANYL CITRATE (PF) 250 MCG/5ML IJ SOLN
INTRAMUSCULAR | Status: DC | PRN
  Administered 2018-08-23 (×5): 50 ug via INTRAVENOUS

## 2018-08-23 MED ORDER — NALOXONE HCL 0.4 MG/ML IJ SOLN: 0.4000 mg | INTRAMUSCULAR | Status: DC | PRN

## 2018-08-23 MED ORDER — TRAMADOL HCL 50 MG PO TABS: 50.0000 mg | ORAL_TABLET | Freq: Four times a day (QID) | ORAL | Status: DC | PRN

## 2018-08-23 MED ORDER — PHENYLEPHRINE 40 MCG/ML (10ML) SYRINGE FOR IV PUSH (FOR BLOOD PRESSURE SUPPORT)
PREFILLED_SYRINGE | INTRAVENOUS | Status: DC | PRN
  Administered 2018-08-23: 120 ug via INTRAVENOUS

## 2018-08-23 MED ORDER — ONDANSETRON HCL 4 MG/2ML IJ SOLN
INTRAMUSCULAR | Status: AC
  Filled 2018-08-23: qty 2

## 2018-08-23 MED ORDER — OXYCODONE HCL 5 MG PO TABS
5.0000 mg | ORAL_TABLET | ORAL | Status: DC | PRN
  Administered 2018-08-25: 13:00:00 10 mg via ORAL
  Filled 2018-08-23: qty 2

## 2018-08-23 MED ORDER — SODIUM CHLORIDE (PF) 0.9 % IJ SOLN
INTRAMUSCULAR | Status: DC | PRN
  Administered 2018-08-23: 50 mL

## 2018-08-23 MED ORDER — EPHEDRINE SULFATE-NACL 50-0.9 MG/10ML-% IV SOSY
PREFILLED_SYRINGE | INTRAVENOUS | Status: DC | PRN
  Administered 2018-08-23: 10 mg via INTRAVENOUS

## 2018-08-23 MED ORDER — FENTANYL CITRATE (PF) 250 MCG/5ML IJ SOLN
INTRAMUSCULAR | Status: AC
  Filled 2018-08-23: qty 5

## 2018-08-23 MED ORDER — 0.9 % SODIUM CHLORIDE (POUR BTL) OPTIME
TOPICAL | Status: DC | PRN
  Administered 2018-08-23: 3000 mL

## 2018-08-23 MED ORDER — SUCCINYLCHOLINE CHLORIDE 200 MG/10ML IV SOSY
PREFILLED_SYRINGE | INTRAVENOUS | Status: DC | PRN
  Administered 2018-08-23: 140 mg via INTRAVENOUS

## 2018-08-23 MED ORDER — MIDAZOLAM HCL 2 MG/2ML IJ SOLN
INTRAMUSCULAR | Status: AC
  Filled 2018-08-23: qty 2

## 2018-08-23 MED ORDER — FENTANYL CITRATE (PF) 100 MCG/2ML IJ SOLN
25.0000 ug | INTRAMUSCULAR | Status: DC | PRN
  Administered 2018-08-23: 25 ug via INTRAVENOUS
  Administered 2018-08-23: 12:00:00 50 ug via INTRAVENOUS

## 2018-08-23 MED ORDER — POTASSIUM CHLORIDE IN NACL 20-0.9 MEQ/L-% IV SOLN
INTRAVENOUS | Status: DC
  Administered 2018-08-23 – 2018-08-24 (×2): via INTRAVENOUS
  Filled 2018-08-23 (×2): qty 1000

## 2018-08-23 MED ORDER — LACTATED RINGERS IV SOLN
INTRAVENOUS | Status: DC | PRN
  Administered 2018-08-23: 07:00:00 via INTRAVENOUS

## 2018-08-23 MED ORDER — DEXAMETHASONE SODIUM PHOSPHATE 10 MG/ML IJ SOLN
INTRAMUSCULAR | Status: DC | PRN
  Administered 2018-08-23: 10 mg via INTRAVENOUS

## 2018-08-23 MED ORDER — PROPOFOL 10 MG/ML IV BOLUS
INTRAVENOUS | Status: AC
  Filled 2018-08-23: qty 40

## 2018-08-23 MED ORDER — ONDANSETRON HCL 4 MG/2ML IJ SOLN
4.0000 mg | Freq: Four times a day (QID) | INTRAMUSCULAR | Status: DC | PRN
  Administered 2018-08-24 – 2018-08-25 (×2): 4 mg via INTRAVENOUS
  Filled 2018-08-23 (×2): qty 2

## 2018-08-23 MED ORDER — FENTANYL 40 MCG/ML IV SOLN
INTRAVENOUS | Status: DC
  Administered 2018-08-23: 230 ug via INTRAVENOUS
  Administered 2018-08-23: 13:00:00 via INTRAVENOUS
  Administered 2018-08-23: 120 ug via INTRAVENOUS
  Administered 2018-08-23: 200 ug via INTRAVENOUS
  Administered 2018-08-24: 11:00:00 via INTRAVENOUS
  Administered 2018-08-24: 60 ug via INTRAVENOUS
  Administered 2018-08-24: 140 ug via INTRAVENOUS
  Administered 2018-08-24: 30 ug via INTRAVENOUS
  Administered 2018-08-24: 270 ug via INTRAVENOUS
  Administered 2018-08-25 (×2): 0 ug via INTRAVENOUS
  Administered 2018-08-25: 15 ug via INTRAVENOUS
  Administered 2018-08-25 (×2): 0 ug via INTRAVENOUS
  Administered 2018-08-25: 60 ug via INTRAVENOUS
  Administered 2018-08-25: 0 ug via INTRAVENOUS
  Administered 2018-08-26: 30 ug via INTRAVENOUS
  Administered 2018-08-26: 0 ug via INTRAVENOUS
  Administered 2018-08-26 (×2): 15 ug via INTRAVENOUS
  Administered 2018-08-26 – 2018-08-27 (×4): 0 ug via INTRAVENOUS
  Administered 2018-08-28: 15 ug via INTRAVENOUS
  Administered 2018-08-28: 60 ug via INTRAVENOUS
  Administered 2018-08-28: 0 ug via INTRAVENOUS
  Administered 2018-08-29: 45 ug via INTRAVENOUS
  Administered 2018-08-29: 15 ug via INTRAVENOUS
  Filled 2018-08-23: qty 1000
  Filled 2018-08-23 (×3): qty 25

## 2018-08-23 MED ORDER — ONDANSETRON HCL 4 MG/2ML IJ SOLN
4.0000 mg | Freq: Once | INTRAMUSCULAR | Status: AC | PRN
  Administered 2018-08-23: 12:00:00 4 mg via INTRAVENOUS

## 2018-08-23 MED ORDER — ALBUMIN HUMAN 5 % IV SOLN
12.5000 g | Freq: Once | INTRAVENOUS | Status: AC
  Administered 2018-08-23: 14:00:00 12.5 g via INTRAVENOUS

## 2018-08-23 MED ORDER — HEMOSTATIC AGENTS (NO CHARGE) OPTIME
TOPICAL | Status: DC | PRN
  Administered 2018-08-23 (×2): 1 via TOPICAL

## 2018-08-23 MED ORDER — INSULIN ASPART 100 UNIT/ML ~~LOC~~ SOLN
0.0000 [IU] | Freq: Four times a day (QID) | SUBCUTANEOUS | Status: DC
  Administered 2018-08-23: 2 [IU] via SUBCUTANEOUS

## 2018-08-23 MED ORDER — CEFAZOLIN SODIUM-DEXTROSE 2-4 GM/100ML-% IV SOLN
2.0000 g | INTRAVENOUS | Status: AC
  Administered 2018-08-23: 2 g via INTRAVENOUS
  Filled 2018-08-23: qty 100

## 2018-08-23 MED ORDER — ASPIRIN EC 81 MG PO TBEC
81.0000 mg | DELAYED_RELEASE_TABLET | Freq: Every day | ORAL | Status: DC
  Administered 2018-08-24 – 2018-08-29 (×6): 81 mg via ORAL
  Filled 2018-08-23 (×6): qty 1

## 2018-08-23 MED ORDER — FERROUS SULFATE 325 (65 FE) MG PO TABS
325.0000 mg | ORAL_TABLET | Freq: Every day | ORAL | Status: DC
  Administered 2018-08-24 – 2018-08-30 (×7): 325 mg via ORAL
  Filled 2018-08-23 (×7): qty 1

## 2018-08-23 MED ORDER — OXYCODONE HCL 5 MG/5ML PO SOLN: 5.0000 mg | Freq: Once | ORAL | Status: DC | PRN

## 2018-08-23 MED ORDER — SIMVASTATIN 20 MG PO TABS
20.0000 mg | ORAL_TABLET | Freq: Every day | ORAL | Status: DC
  Administered 2018-08-24 – 2018-08-29 (×6): 20 mg via ORAL
  Filled 2018-08-23 (×6): qty 1

## 2018-08-23 MED ORDER — FENTANYL CITRATE (PF) 100 MCG/2ML IJ SOLN
INTRAMUSCULAR | Status: AC
  Filled 2018-08-23: qty 2

## 2018-08-23 MED ORDER — DIPHENHYDRAMINE HCL 12.5 MG/5ML PO ELIX
12.5000 mg | ORAL_SOLUTION | Freq: Four times a day (QID) | ORAL | Status: DC | PRN
  Filled 2018-08-23: qty 5

## 2018-08-23 SURGICAL SUPPLY — 90 items
BLADE CLIPPER SURG (BLADE) ×4 IMPLANT
CANISTER SUCT 3000ML PPV (MISCELLANEOUS) ×8 IMPLANT
CATH THORACIC 28FR (CATHETERS) ×2 IMPLANT
CATH THORACIC 36FR (CATHETERS) IMPLANT
CATH THORACIC 36FR RT ANG (CATHETERS) IMPLANT
CLIP VESOCCLUDE MED 6/CT (CLIP) ×4 IMPLANT
CONN ST 1/4X3/8  BEN (MISCELLANEOUS)
CONN ST 1/4X3/8 BEN (MISCELLANEOUS) IMPLANT
CONN Y 3/8X3/8X3/8  BEN (MISCELLANEOUS)
CONN Y 3/8X3/8X3/8 BEN (MISCELLANEOUS) IMPLANT
CONT SPEC 4OZ CLIKSEAL STRL BL (MISCELLANEOUS) ×14 IMPLANT
COVER SURGICAL LIGHT HANDLE (MISCELLANEOUS) ×4 IMPLANT
COVER WAND RF STERILE (DRAPES) ×4 IMPLANT
CUTTER ECHEON FLEX ENDO 45 340 (ENDOMECHANICALS) ×2 IMPLANT
DERMABOND ADVANCED (GAUZE/BANDAGES/DRESSINGS) ×2
DERMABOND ADVANCED .7 DNX12 (GAUZE/BANDAGES/DRESSINGS) ×2 IMPLANT
DRAIN CHANNEL 28F RND 3/8 FF (WOUND CARE) IMPLANT
DRAIN CHANNEL 32F RND 10.7 FF (WOUND CARE) IMPLANT
DRAPE CV SPLIT W-CLR ANES SCRN (DRAPES) ×4 IMPLANT
DRAPE ORTHO SPLIT 77X108 STRL (DRAPES) ×2
DRAPE SURG ORHT 6 SPLT 77X108 (DRAPES) ×2 IMPLANT
DRAPE WARM FLUID 44X44 (DRAPES) ×2 IMPLANT
ELECT BLADE 6.5 EXT (BLADE) ×4 IMPLANT
ELECT REM PT RETURN 9FT ADLT (ELECTROSURGICAL) ×4
ELECTRODE REM PT RTRN 9FT ADLT (ELECTROSURGICAL) ×2 IMPLANT
GAUZE SPONGE 4X4 12PLY STRL (GAUZE/BANDAGES/DRESSINGS) IMPLANT
GAUZE SPONGE 4X4 12PLY STRL LF (GAUZE/BANDAGES/DRESSINGS) ×2 IMPLANT
GLOVE SURG SIGNA 7.5 PF LTX (GLOVE) ×8 IMPLANT
GOWN STRL REUS W/ TWL LRG LVL3 (GOWN DISPOSABLE) ×4 IMPLANT
GOWN STRL REUS W/ TWL XL LVL3 (GOWN DISPOSABLE) ×4 IMPLANT
GOWN STRL REUS W/TWL LRG LVL3 (GOWN DISPOSABLE) ×4
GOWN STRL REUS W/TWL XL LVL3 (GOWN DISPOSABLE) ×4
HEMOSTAT SURGICEL 2X14 (HEMOSTASIS) ×4 IMPLANT
KIT BASIN OR (CUSTOM PROCEDURE TRAY) ×4 IMPLANT
KIT SUCTION CATH 14FR (SUCTIONS) ×4 IMPLANT
KIT TURNOVER KIT B (KITS) ×4 IMPLANT
NDL SPNL 18GX3.5 QUINCKE PK (NEEDLE) IMPLANT
NEEDLE SPNL 18GX3.5 QUINCKE PK (NEEDLE) IMPLANT
NS IRRIG 1000ML POUR BTL (IV SOLUTION) ×12 IMPLANT
PACK CHEST (CUSTOM PROCEDURE TRAY) ×4 IMPLANT
PAD ARMBOARD 7.5X6 YLW CONV (MISCELLANEOUS) ×8 IMPLANT
POUCH ENDO CATCH II 15MM (MISCELLANEOUS) ×2 IMPLANT
POUCH SPECIMEN RETRIEVAL 10MM (ENDOMECHANICALS) IMPLANT
RELOAD STAPLE 35X2.5 WHT THIN (STAPLE) IMPLANT
RELOAD STAPLE 45 4.1 GRN THCK (STAPLE) IMPLANT
RELOAD STAPLE 45 GOLD REG/THCK (STAPLE) IMPLANT
SCISSORS LAP 5X35 DISP (ENDOMECHANICALS) IMPLANT
SEALANT PROGEL (MISCELLANEOUS) IMPLANT
SEALANT SURG COSEAL 4ML (VASCULAR PRODUCTS) IMPLANT
SEALANT SURG COSEAL 8ML (VASCULAR PRODUCTS) IMPLANT
SHEARS HARMONIC HDI 20CM (ELECTROSURGICAL) ×2 IMPLANT
SOLUTION ANTI FOG 6CC (MISCELLANEOUS) ×4 IMPLANT
SPECIMEN JAR MEDIUM (MISCELLANEOUS) ×4 IMPLANT
SPONGE INTESTINAL PEANUT (DISPOSABLE) ×14 IMPLANT
SPONGE TONSIL TAPE 1 RFD (DISPOSABLE) ×4 IMPLANT
STAPLE RELOAD 2.5MM WHITE (STAPLE) ×28 IMPLANT
STAPLE RELOAD 45 GRN (STAPLE) ×2 IMPLANT
STAPLE RELOAD 45MM GOLD (STAPLE) ×52 IMPLANT
STAPLE RELOAD 45MM GREEN (STAPLE) ×2
STAPLER VASCULAR ECHELON 35 (CUTTER) ×2 IMPLANT
SUT PROLENE 4 0 RB 1 (SUTURE) ×2
SUT PROLENE 4-0 RB1 .5 CRCL 36 (SUTURE) IMPLANT
SUT SILK  1 MH (SUTURE) ×4
SUT SILK 1 MH (SUTURE) ×4 IMPLANT
SUT SILK 1 TIES 10X30 (SUTURE) ×4 IMPLANT
SUT SILK 2 0 SH (SUTURE) IMPLANT
SUT SILK 2 0SH CR/8 30 (SUTURE) IMPLANT
SUT SILK 3 0 SH 30 (SUTURE) IMPLANT
SUT SILK 3 0SH CR/8 30 (SUTURE) ×4 IMPLANT
SUT VIC AB 0 CTX 27 (SUTURE) ×2 IMPLANT
SUT VIC AB 1 CTX 27 (SUTURE) ×4 IMPLANT
SUT VIC AB 2-0 CT1 27 (SUTURE)
SUT VIC AB 2-0 CT1 TAPERPNT 27 (SUTURE) IMPLANT
SUT VIC AB 2-0 CTX 36 (SUTURE) ×8 IMPLANT
SUT VIC AB 3-0 MH 27 (SUTURE) IMPLANT
SUT VIC AB 3-0 SH 27 (SUTURE)
SUT VIC AB 3-0 SH 27X BRD (SUTURE) IMPLANT
SUT VIC AB 3-0 X1 27 (SUTURE) ×6 IMPLANT
SUT VICRYL 0 UR6 27IN ABS (SUTURE) IMPLANT
SUT VICRYL 2 TP 1 (SUTURE) IMPLANT
SYR 30ML LL (SYRINGE) ×4 IMPLANT
SYSTEM SAHARA CHEST DRAIN ATS (WOUND CARE) ×4 IMPLANT
TAPE CLOTH SURG 4X10 WHT LF (GAUZE/BANDAGES/DRESSINGS) ×2 IMPLANT
TIP APPLICATOR SPRAY EXTEND 16 (VASCULAR PRODUCTS) IMPLANT
TOWEL GREEN STERILE (TOWEL DISPOSABLE) ×4 IMPLANT
TOWEL GREEN STERILE FF (TOWEL DISPOSABLE) ×4 IMPLANT
TRAY FOLEY MTR SLVR 16FR STAT (SET/KITS/TRAYS/PACK) ×4 IMPLANT
TROCAR XCEL BLADELESS 5X75MML (TROCAR) ×4 IMPLANT
TROCAR XCEL NON-BLD 5MMX100MML (ENDOMECHANICALS) IMPLANT
WATER STERILE IRR 1000ML POUR (IV SOLUTION) ×8 IMPLANT

## 2018-08-23 NOTE — Anesthesia Procedure Notes (Signed)
Procedure Name: Intubation Date/Time: 08/23/2018 7:46 AM Performed by: Imagene Riches, CRNA Pre-anesthesia Checklist: Patient identified, Emergency Drugs available, Suction available and Patient being monitored Patient Re-evaluated:Patient Re-evaluated prior to induction Oxygen Delivery Method: Circle System Utilized Preoxygenation: Pre-oxygenation with 100% oxygen Induction Type: IV induction Ventilation: Mask ventilation without difficulty Laryngoscope Size: Mac and 3 Grade View: Grade I Tube type: Oral Endobronchial tube: Left, Double lumen EBT, EBT position confirmed by auscultation and EBT position confirmed by fiberoptic bronchoscope and 37 Fr Number of attempts: 1 Airway Equipment and Method: Stylet and Oral airway Placement Confirmation: ETT inserted through vocal cords under direct vision,  positive ETCO2 and breath sounds checked- equal and bilateral Secured at: 29 cm Tube secured with: Tape Dental Injury: Teeth and Oropharynx as per pre-operative assessment

## 2018-08-23 NOTE — Plan of Care (Signed)
  Problem: Education: Goal: Knowledge of disease or condition will improve Outcome: Progressing   Problem: Activity: Goal: Risk for activity intolerance will decrease Outcome: Progressing   Problem: Clinical Measurements: Goal: Postoperative complications will be avoided or minimized Outcome: Progressing   Problem: Respiratory: Goal: Respiratory status will improve Outcome: Progressing   Problem: Pain Management: Goal: Pain level will decrease Outcome: Progressing   Problem: Education: Goal: Knowledge of General Education information will improve Description: Including pain rating scale, medication(s)/side effects and non-pharmacologic comfort measures Outcome: Progressing   Problem: Clinical Measurements: Goal: Respiratory complications will improve Outcome: Progressing   Problem: Nutrition: Goal: Adequate nutrition will be maintained Outcome: Progressing

## 2018-08-23 NOTE — Interval H&P Note (Signed)
History and Physical Interval Note:  08/23/2018 7:21 AM  Cindy Butler  has presented today for surgery, with the diagnosis of RUL LUNG NODULES.  The various methods of treatment have been discussed with the patient and family. After consideration of risks, benefits and other options for treatment, the patient has consented to  Procedure(s): VIDEO ASSISTED THORACOSCOPY (VATS)/WEDGE RESECTION (Right) possuble upper LOBECTOMY (Right) as a surgical intervention.  The patient's history has been reviewed, patient examined, no change in status, stable for surgery.  I have reviewed the patient's chart and labs.  Questions were answered to the patient's satisfaction.     Melrose Nakayama

## 2018-08-23 NOTE — Anesthesia Procedure Notes (Signed)
Arterial Line Insertion Start/End8/14/2020 6:50 AM, 08/23/2018 7:00 AM Performed by: Imagene Riches, CRNA, CRNA  Patient location: Pre-op. Preanesthetic checklist: patient identified, IV checked, site marked, risks and benefits discussed, surgical consent, monitors and equipment checked, pre-op evaluation, timeout performed and anesthesia consent Right, radial was placed Catheter size: 20 G Hand hygiene performed  and maximum sterile barriers used   Attempts: 1 Procedure performed without using ultrasound guided technique. Following insertion, dressing applied and Biopatch. Post procedure assessment: normal

## 2018-08-23 NOTE — Transfer of Care (Signed)
Immediate Anesthesia Transfer of Care Note  Patient: Cindy Butler  Procedure(s) Performed: Right Video Assisted Thoracoscopy (Vats) with Right Upper Lobectomy and Node Dissection (Right Chest)  Patient Location: PACU  Anesthesia Type:General  Level of Consciousness: awake, alert  and oriented  Airway & Oxygen Therapy: Patient Spontanous Breathing and Patient connected to face mask oxygen  Post-op Assessment: Report given to RN and Post -op Vital signs reviewed and stable  Post vital signs: Reviewed and stable  Last Vitals:  Vitals Value Taken Time  BP 109/50 08/23/18 1156  Temp    Pulse 58 08/23/18 1200  Resp 23 08/23/18 1200  SpO2 99 % 08/23/18 1200  Vitals shown include unvalidated device data.  Last Pain:  Vitals:   08/23/18 0613  TempSrc:   PainSc: 0-No pain         Complications: No apparent anesthesia complications

## 2018-08-23 NOTE — Anesthesia Postprocedure Evaluation (Signed)
Anesthesia Post Note  Patient: Cindy Butler  Procedure(s) Performed: Right Video Assisted Thoracoscopy (Vats) with Right Upper Lobectomy and Node Dissection (Right Chest)     Patient location during evaluation: PACU Anesthesia Type: General Level of consciousness: awake and alert Pain management: pain level controlled Vital Signs Assessment: post-procedure vital signs reviewed and stable Respiratory status: spontaneous breathing, nonlabored ventilation, respiratory function stable and patient connected to nasal cannula oxygen Cardiovascular status: blood pressure returned to baseline and stable Postop Assessment: no apparent nausea or vomiting Anesthetic complications: no    Last Vitals:  Vitals:   08/23/18 1315 08/23/18 1345  BP: (!) 103/46 (!) 100/48  Pulse: 62 60  Resp: 19 13  Temp: (!) 36.1 C   SpO2: 97% 99%    Last Pain:  Vitals:   08/23/18 1315  TempSrc:   PainSc: [place]

## 2018-08-23 NOTE — Discharge Summary (Addendum)
Physician Discharge Summary       [address]       [place] [address]             [phone]    Patient ID: Cindy Butler MRN: [id number] DOB/AGE: Jan 10, 1950 69 y.o.  Admit date: 08/23/2018 Discharge date: 08/30/2018  Admission Diagnoses: Nodule of upper lobe of right lung- suspected non-small cell carcinoma  Discharge Diagnoses:  Adenocarcinoma right upper lobe of lung - Clinical and Pathologic stage IIB(T3N0) 1. S/p right VATS, RUL 2. Post op atrial fibrillation 3. History of tobacco abuse 4. History of hyperlipidemia 5. History of osteoporosis 6. History of esophageal reflux    Consultant: Cardiology  Procedure (s):  Right Video Assisted Thoracoscopy (VATS), Thoracoscopic right upper lobectomy, Lymph node Dissection, Intercostal nerve block by Dr. Roxan Hockey on 08/23/2018.  Pathology: Lung, resection (segmental or lobe), Right upperor lobe), Right upper Lobe - ADENOCARCINOMA, TWO TUMORS, 1.3 AND 0.7 CM. - MARGINS NOT INVOLVED. - ONE BENIGN LYMPH NODE. 2. Lymph node, biopsy, Level 7 node - ANTHRACOTIC LYMPH NODE. - NO TUMOR IDENTIFIED. 3. Lymph node, biopsy, Level 7 #2 - ANTHRACOTIC LYMPH NODE. - NO TUMOR IDENTIFIED. 4. Lymph node, biopsy, Level 7 #3 - ANTHRACOTIC LYMPH NODE. - NO TUMOR IDENTIFIED. 5. Lymph node, biopsy, Level 12 - ANTHRACOTIC LYMPH NODE. - NO TUMOR IDENTIFIED. 6. Lymph node, biopsy, Level 10 - ANTHRACOTIC LYMPH NODE. - NO TUMOR IDENTIFIED. 7. Lymph node, biopsy, Level 11 - ANTHRACOTIC LYMPH NODE. - NO TUMOR IDENTIFIED. 8. Lymph node, biopsy, Level 12 #2 - ANTHRACOTIC LYMPH NODE. - NO TUMOR IDENTIFIED. 9. Lymph node, biopsy, Level 12 #3 - ANTHRACOTIC LYMPH NODE. - NO TUMOR IDENTIFIED. 10. Lymph node, biopsy, Level 12 #4 - ANTHRACOTIC LYMPH NODE. NO TUMOR IDENTIFIED. 11. Lymph node, biopsy, Level 11 #2 - ANTHRACOTIC LYMPH NODE. - NO TUMOR IDENTIFIED. 12. Lymph node, biopsy, 4R node - ANTHRACOTIC LYMPH  NODE. - NO TUMOR IDENTIFIED. 13. Lymph node, biopsy, 4R #2 - ANTHRACOTIC LYMPH NODE. - NO TUMOR IDENTIFIED. 14. Lymph node, biopsy, 2R - ANTHRACOTIC LYMPH NODE. - NO TUMOR IDENTIFIED. 15. Lymph node, biopsy, Level 7 #4 - ANTHRACOTIC LYMPH NODE. - NO TUMOR IDENTIFIED. TNM Code: pT1b (multifocal), pN0  History of Presenting Illness: Cindy Butler is a 68 year old woman with a history of tobacco abuse, hyperlipidemia, reflux, osteoporosis, and recently diagnosed thrombocytopenia.  She recently had a annual physical.  Blood work showed thrombocytopenia.  She was referred to Dr. Delton Coombes.  She also was referred for low-dose CT screening due to her smoking history.  The LD CT showed a spiculated nodule in the right upper lobe measuring 1.2 cm.  There was a second more peripheral nodule approximately 6 mm in diameter.  A PET/CT showed the dominant nodule was hypermetabolic with an SUV of 2.3.  There also was some minimal uptake in the 6 mm nodule with an SUV of 1.2.  There was no significant adenopathy or evidence of metastatic disease.  She is still smoking.  She smoked about a pack a day for 50 years.  She has cut back a little.  She has been resistant to trying a patch.  She denies cough, hemoptysis, and wheezing.  She denies chest pain, pressure, tightness, or shortness of breath with exertion.  She can walk up a flight of stairs without stopping.  Her appetite is good.  She denies any weight loss.  Dr. Roxan Hockey reviewed the CT and PET/CT images with [name].  We discussed the differential  diagnosis.  We discussed potential options for diagnosis and treatment of the nodules.  We discussed the relative advantages and disadvantages of surgical resection versus radiation therapy.  We discussed the limitations of CT guided and bronchoscopic biopsy given the central location of the nodule.  If either of those modalities showed a positive result surgery would be indicated.  If negative,  unfortunately, it would not completely rule out the possibility of cancer.  Given that she is a good surgical candidate and the high probability that this is malignant, Dr. Roxan Hockey recommended that we proceed directly to VATS for wedge resection and possible upper lobectomy.  Dr. Roxan Hockey informed her of the general nature of the operation including the need for general anesthesia, the incision to be used, the use of drainage tube postoperatively, the expected hospital stay, and the overall recovery.  Dr. Roxan Hockey informed her of the indications, risk, benefits, and alternatives.  She was admitted on 08/23/2018 to undergo an intercostal nerve block, right VATS, RUL, LN dissection.  Brief Hospital Course:  The patient remained afebrile and hemodynamically stable. A line and foley were removed early in the post operative course. Chest tube output gradually decreased. Daily chest x rays were obtained initially showed a small increase in right apical pneumothorax. Chest tube did have a small, intermittent air leak with cough.  It was no longer present so chest tube was placed to water seal the afternoon of 08/17. Follow up chest x ray showed an increase in the right apical pneumothorax. Chest tube was placed back to suction.  Again, there was no air leak, chest x ray showed a decrease in right apical pneumothorax, so chest tube was placed to water seal on 08/19. She went back into a fib with RVR early 08/19. She was given an Amiodarone bolus, IV lopressor, put back on Amiodarone drip. She was not started on oral Lopressor as she was brady in the 50's upon conversion to sinus rhythm.She had no air leak and small space was evident on subsequent chest x rays. Chest tube was removed on 08/20.  She continued to have PAF. Cardiology was consulted on 08/21 and she was started on Apixaban. In addition, Amiodarone 200 mg bid for one week followed by daily thereafter. It was recommended to stop ec asa for now as  well. An echo and TSH have been ordered as well. TSH was found to be 3.415 and Magnesium is 1.8. Echo has been taken but results are pending. I will follow up and call patient with results on Monday 08/24. Patient will follow up in the [facility]iology office in [place]. Patient is ambulating on room air. Patient is tolerating a diet and has had a bowel movement. Wounds are clean and dry. Final chest X ray showed a stable, small right apical pneumothorax and stable subcutaneous emphysema. As discussed with surgeon and cardiology, she is felt surgically stable for discharge today.   Latest Vital Signs: Blood pressure 104/82, pulse (!) 55, temperature 98.4 F (36.9 C), temperature source Oral, resp. rate 17, height 5\' 5"  (1.651 m), weight 78 kg, SpO2 100 %.  Physical Exam: Cardiovascular: RRR Pulmonary: Clear to auscultation bilaterally Abdomen: Soft, non tender, bowel sounds present. Extremities: No lower extremity edema. Wound: Clean and dry.  No erythema or signs of infection.  Discharge Condition: Stable and discharged to home.  Recent laboratory studies:  Lab Results  Component Value Date   WBC 6.2 08/25/2018   HGB 10.2 (L) 08/25/2018   HCT 31.0 (L) 08/25/2018  MCV 99.0 08/25/2018   PLT 114 (L) 08/25/2018   Lab Results  Component Value Date   NA 139 08/29/2018   K 3.7 08/29/2018   CL 105 08/29/2018   CO2 25 08/29/2018   CREATININE 1.06 (H) 08/29/2018   GLUCOSE 105 (H) 08/29/2018      Diagnostic Studies: Dg Chest 2 View  Result Date: 08/30/2018 CLINICAL DATA:  Status post right lobectomy. EXAM: CHEST - 2 VIEW COMPARISON:  Radiograph of August 29, 2018. FINDINGS: Stable cardiomediastinal silhouette. Atherosclerosis of thoracic aorta is noted. Left lung is clear. Right-sided chest tube has been removed. Stable small right apical pneumothorax is noted. Minimal right basilar subsegmental atelectasis is noted. Stable subcutaneous emphysema is seen in the right lateral chest  wall. Bony thorax is unremarkable. IMPRESSION: Stable small right apical pneumothorax is noted status post right-sided chest tube removal. Stable subcutaneous emphysema seen in right lateral chest wall. Aortic Atherosclerosis (ICD10-I70.0). Electronically Signed   By: Marijo Conception M.D.   On: 08/30/2018 08:00   Dg Chest 2 View  Result Date: 08/20/2018 CLINICAL DATA:  Right lung nodule.  Pre-op respiratory exam EXAM: CHEST - 2 VIEW COMPARISON:  12/12/2012 FINDINGS: The heart size and mediastinal contours are within normal limits. Aortic atherosclerosis. Both lungs are clear. The visualized skeletal structures are unremarkable. IMPRESSION: No active cardiopulmonary disease. Electronically Signed   By: Marlaine Hind M.D.   On: 08/20/2018 16:16   US Carotid Bilateral  Result Date: 08/12/2018 CLINICAL DATA:  Preoperative examination for impending thoracic surgery. EXAM: BILATERAL CAROTID DUPLEX ULTRASOUND TECHNIQUE: Pearline Cables scale imaging, color Doppler and duplex ultrasound were performed of bilateral carotid and vertebral arteries in the neck. COMPARISON:  Carotid Doppler ultrasound-05/10/2017 FINDINGS: Criteria: Quantification of carotid stenosis is based on velocity parameters that correlate the residual internal carotid diameter with NASCET-based stenosis levels, using the diameter of the distal internal carotid lumen as the denominator for stenosis measurement. The following velocity measurements were obtained: RIGHT ICA: 99/30 cm/sec CCA: 841/66 cm/sec SYSTOLIC ICA/CCA RATIO:  0.9 ECA: 195 cm/sec LEFT ICA: 110/32 cm/sec CCA: 06/30 cm/sec SYSTOLIC ICA/CCA RATIO:  1.2 ECA: 130 cm/sec RIGHT CAROTID ARTERY: There is a minimal amount of eccentric echogenic plaque within the right carotid bulb, extending to involve the origin and proximal aspects of the right internal carotid artery (image 35). There is a moderate to large amount of eccentric mixed echogenic plaque involving the mid aspect of the right common carotid  artery (image 39), morphologically progressed compared to the 05/2017 examination, though again not resulting in elevated peak systolic velocities within the right internal carotid artery to suggest a hemodynamically significant stenosis. RIGHT VERTEBRAL ARTERY:  Antegrade Flow LEFT CAROTID ARTERY: There is a minimal amount of eccentric hypoechoic plaque within the left carotid bulb (image 72). There is a minimal to moderate amount of eccentric mixed echogenic plaque involving the origin and proximal aspects of the left internal carotid artery (image 83 and 86), morphologically progressed compared to the 05/2017 examination though again not resulting in elevated peak systolic velocities within the interrogated course of the left internal carotid artery to suggest a hemodynamically significant stenosis. LEFT VERTEBRAL ARTERY:  Antegrade Flow IMPRESSION: Moderate to large amount of bilateral atherosclerotic plaque, right greater than left, likely progressed compared to the 05/2017 examination, though not resulting in a hemodynamically significant stenosis within either internal carotid artery. Electronically Signed   By: Sandi Mariscal M.D.   On: 08/12/2018 12:44   Dg Chest Port 1 View  Result Date: 08/29/2018  CLINICAL DATA:  Chest tube, pneumothorax EXAM: PORTABLE CHEST 1 VIEW COMPARISON:  08/28/2018 FINDINGS: Right chest tube has been placed. Right apical pneumothorax is stable, 10-15%. Postoperative changes in the right lung. Mild cardiomegaly. Left lung clear. IMPRESSION: Interval placement of right chest tube with stable 10-15% right pneumothorax. Electronically Signed   By: Rolm Baptise M.D.   On: 08/29/2018 10:29   Dg Chest Port 1 View  Result Date: 08/29/2018 CLINICAL DATA:  Right upper lobe lobectomy. EXAM: PORTABLE CHEST 1 VIEW COMPARISON:  08/28/2018 FINDINGS: Stable cardiomediastinal contours. Right-sided chest tube remains in place. Stable size of a small right apical pneumothorax. Left lung remains  clear. Mild subcutaneous emphysema at the right lateral chest wall. IMPRESSION: Stable small right apical pneumothorax. Electronically Signed   By: Davina Poke M.D.   On: 08/29/2018 09:38   Dg Chest Port 1 View  Result Date: 08/28/2018 CLINICAL DATA:  Follow-up right chest tube EXAM: PORTABLE CHEST 1 VIEW COMPARISON:  08/27/2018 FINDINGS: Cardiac shadow is stable. Aortic calcifications are again seen. Right apically pneumothorax is again noted and stable. Right chest tube is again seen in satisfactory position. No focal infiltrate is noted. IMPRESSION: Stable right pneumothorax. Electronically Signed   By: Inez Catalina M.D.   On: 08/28/2018 07:56   Dg Chest Port 1 View  Result Date: 08/27/2018 CLINICAL DATA:  Shortness of breath, chest tube, pneumothorax EXAM: PORTABLE CHEST 1 VIEW COMPARISON:  08/26/2018 FINDINGS: Right chest tube remains in place, unchanged. Right side pneumothorax is stable. Patchy airspace disease in the right lung, likely atelectasis. Left lung clear. Cardiomegaly. No effusions or acute bony abnormality. IMPRESSION: Stable moderate-sized right pneumothorax with right chest tube remaining in stable position. Electronically Signed   By: Rolm Baptise M.D.   On: 08/27/2018 08:00   Dg Chest Port 1 View  Result Date: 08/26/2018 CLINICAL DATA:  Sore chest, chest tube present. EXAM: PORTABLE CHEST 1 VIEW COMPARISON:  Chest x-rays dated 08/25/2018 and 08/24/2018. FINDINGS: RIGHT-sided chest tube is stable in position with tip directed towards the RIGHT hilum. The RIGHT apical pneumothorax is slightly larger than on yesterday's exam, at least moderate in size. No confluent opacity to suggest a developing pneumonia. No pleural effusion seen. Heart size and mediastinal contours are stable. IMPRESSION: Slight interval enlargement of the RIGHT apical pneumothorax, now at least moderate in size. Right-sided chest tube is stable in position. These results will be called to the ordering  clinician or representative by the Radiologist Assistant, and communication documented in the PACS or zVision Dashboard. Electronically Signed   By: Franki Cabot M.D.   On: 08/26/2018 08:16   Dg Chest Port 1 View  Result Date: 08/25/2018 CLINICAL DATA:  Right pneumothorax EXAM: PORTABLE CHEST 1 VIEW COMPARISON:  Yesterday FINDINGS: Small right apical pneumothorax with mild increase from before. At the apex the gas collection measures 18 mm craniocaudal compared to 12 mm previously. The chest tube is in similar position at the base. Postoperative volume loss and perihilar opacity on the right. Mild atelectatic type density on the left. IMPRESSION: Mild increase in the right pneumothorax. Electronically Signed   By: Monte Fantasia M.D.   On: 08/25/2018 07:45   Dg Chest Port 1 View  Result Date: 08/24/2018 CLINICAL DATA:  68 year old female with a right-sided pneumothorax, chest tube in place EXAM: PORTABLE CHEST 1 VIEW COMPARISON:  Prior chest x-ray 08/23/2018 FINDINGS: Persistent and essentially unchanged small right apical pneumothorax with evidence of recent VATS and right upper lobectomy. Chest tube remains  in good position. The left lung is clear. Cardiac and mediastinal contours are unchanged. Mild cardiomegaly. Atherosclerotic calcifications again noted in the transverse aorta. No acute osseous abnormality. IMPRESSION: 1. Stable small right apical pneumothorax with chest tube in place. 2. No new abnormality. Electronically Signed   By: Jacqulynn Cadet M.D.   On: 08/24/2018 08:59   Dg Chest Port 1 View  Result Date: 08/23/2018 CLINICAL DATA:  Evaluate for pneumothorax. EXAM: PORTABLE CHEST 1 VIEW COMPARISON:  August 20, 2018 FINDINGS: The patient has postoperative changes on the right which are new in the interval. A right chest tube terminates in good position medially. There is a small right-sided pneumothorax. No left-sided pneumothorax. No other changes. IMPRESSION: 1. Postoperative changes  on the right with a right chest tube and a small right pneumothorax. Recommend attention on follow-up. Electronically Signed   By: Dorise Bullion III M.D   On: 08/23/2018 12:38    Discharge Medications: Allergies as of 08/30/2018   No Known Allergies     Medication List    STOP taking these medications   aspirin EC 81 MG tablet     TAKE these medications   amiodarone 200 MG tablet Commonly known as: PACERONE Take 1 tablet (200 mg total) by mouth 2 (two) times daily. For one week;then take Amiodarone 200 mg daily thereafter   apixaban 5 MG Tabs tablet Commonly known as: ELIQUIS Take 1 tablet (5 mg total) by mouth 2 (two) times daily.   Calcium 600+D 600-800 MG-UNIT Tabs Generic drug: Calcium Carb-Cholecalciferol Take 1 tablet by mouth daily.   ferrous sulfate 325 (65 FE) MG tablet Take 325 mg by mouth daily with breakfast.   guaiFENesin 600 MG 12 hr tablet Commonly known as: MUCINEX Take 2 tablets (1,200 mg total) by mouth 2 (two) times daily as needed for cough or to loosen phlegm.   omeprazole 40 MG capsule Commonly known as: PRILOSEC Take 40 mg by mouth daily.   Potassium 99 MG Tabs Take 99 mg by mouth daily.   simvastatin 20 MG tablet Commonly known as: ZOCOR Take 20 mg by mouth daily at 6 PM.   traMADol 50 MG tablet Commonly known as: ULTRAM Take 1 tablet (50 mg total) by mouth every 6 (six) hours as needed (mild pain).       Follow Up Appointments: Follow-up Information    Melrose Nakayama, MD. Go on 09/10/2018.   Specialty: Cardiothoracic Surgery Why: PA/LAT CXR to be taken (at [facility] which is in the same building as Dr. Leonarda Salon office) on 09/01at 4:15 pm;Appointment time is at 4:45 pm Contact information: [place] [address] [phone]        Satira Sark, MD Follow up.   Specialty: Cardiology Why: If [facility] Cardiology office does not call you by Monday 08/24 about an appointment,  please contact their office. Dr. Domenic Polite or one of his partners or physician assistants will be seeing you for atrial fibrillation Contact information: [place] [address] [phone]           Signed: Sharalyn Ink ZimmermanPA-C 08/30/2018, 11:32 AM

## 2018-08-23 NOTE — Op Note (Signed)
NAME: Cindy Butler, MANSHIP MEDICAL RECORD ER:[id number] ACCOUNT [id number] DATE OF BIRTH:03/28/1950 FACILITY: [facility] LOCATION: [facility] PHYSICIAN:Eric Morganti Chaya Jan, MD  OPERATIVE REPORT  DATE OF PROCEDURE:  08/23/2018  PREOPERATIVE DIAGNOSIS:  Right upper lobe lung nodule.  POSTOPERATIVE DIAGNOSIS:  Nonsmall cell carcinoma, right upper lobe, clinical stage T3, N0, IIB.  PROCEDURE:   Right video-assisted thoracoscopy, Thoracoscopic right upper lobectomy, Lymph node dissection, Intercostal nerve blocks levels 3 through 10.  SURGEON:  Modesto Charon, MD  ASSISTANT:  Lars Pinks, PA-C  ANESTHESIA:  General.  FINDINGS:  Adhesions to the posterolateral apical chest wall.  Two nodules in right upper lobe, both nonsmall cell carcinomas.  Bronchial margin free of tumor.  Enlarged, but otherwise benign appearing lymph nodes.  CLINICAL NOTE:  The patient is a 68 year old woman with a history of tobacco abuse, who recently underwent a low-dose CT screening due to her smoking history.  There was a spiculated right upper lobe nodule which measured 1.2 cm.  There was a second  smaller nodule more peripherally that measured 6 mm.  On PET CT, the dominant nodule was mildly hypermetabolic with an SUV of 2.3.  She was advised to undergo right VATS, possible wedge resection, possible right upper lobectomy for diagnosis and  treatment of the lung nodules.  The indications, risks, benefits, and alternatives were discussed in detail with the patient.  She understood and accepted the risks and agreed to proceed.  OPERATIVE NOTE:  The patient was brought to the preoperative holding area on 08/23/2018.  Anesthesia service placed a central venous catheter and an arterial blood pressure monitor.  She was taken to the operating room, anesthetized and intubated with a  double lumen endotracheal tube.  Intravenous antibiotics were administered.  A Foley catheter was placed.  Sequential compression  devices were placed on the calves for DVT prophylaxis.  She was placed in a left lateral decubitus position and the right  chest was prepped and draped in the usual sterile fashion.  Single-lung ventilation of the left lung was initiated and was tolerated well throughout the procedure.  A timeout was performed.  A solution containing 20 mL of liposomal bupivacaine, 30 mL of 0.5% bupivacaine and 50 mL of saline was prepared.  This was used for local at the incision sites as well as for the nerve blocks.  An incision was made in the  eighth interspace in the mid axillary line.  A 5 mm port was inserted into the chest.  The thoracoscope was advanced into the chest.  There was good isolation of the right lung.  There was no pleural effusion.  There were some adhesions near the apex to  the posterolateral chest wall.  An incision was made in the 4th interspace anterolaterally.  No rib spreading was performed during the procedure.  Intercostal nerve blocks were performed from the third to the ninth interspaces.  A needle was placed into  a subpleural plane and 10 mL of the bupivacaine solution was injected at each level.  The inferior ligament was divided.  No lymph node was encountered.  The pleural reflection was divided at the hilum posteriorly.  A relatively large, but otherwise  benign appearing level 7 lymph nodes were removed.  There were multiple nodes along the undersurface of the bronchus in this vicinity.  There was some bleeding with that node dissection as well as throughout the procedure.  There tended to be bleeding  with each maneuver.  The pleural reflection was divided at the hilum anteriorly  and then superiorly.  The adhesions were taken down with cautery.  These were thin filmy relatively avascular adhesions and the pleural reflection was divided at the hilum  superiorly as well.  Lymph nodes that were encountered during the dissection were removed as they were encountered.  All were sent as  separate specimens for permanent pathology.  The fissure was inspected.  It was relatively complete anteriorly, but  posteriorly between the superior segment and the upper lobe it was incomplete.  The right middle pulmonary vein was identified and preserved.  The upper lobe branches of the pulmonary vein were identified.  These were dissected out.  This was a slow and  tedious dissection due to bleeding from surrounding tissues.  Great care was taken with the dissection. Ultimately the most superior branch was encircled and divided with the vascular stapler initially and then the remainder of the superior segmental  vein branches were encircled.  A separate port incision was made anterior to the first and the stapler was passed through this port to divide the main portion of the pulmonary vein.  Dissection was carried back to the fissure.  The pleura overlying the  pulmonary artery at the confluence of the fissures was divided.  The basilar segmental trunk of the pulmonary artery was identified and the superior segmental trunk was identified.  There was a small accessory posterior descending branch arising from the  superior segmental artery.  The major fissure was completed with sequential firings of an Echelon 45 mm stapler using gold cartridges.  A small accessory branch arising from the superior segmental artery then was dissected out and divided with the  vascular stapler.  That exposed the main posterior ascending branch, which was separately dissected out, encircled and divided with the vascular stapler.  Dissection was carried anteriorly once again, the anterior apical branches arose as a large common  trunk off the main pulmonary artery.  There was a relatively large lymph node in this area.  This was a tedious dissection, but was cleared.  The anterior apical trunk then was dissected free of surrounding tissues encircled.  The vascular stapler was  used to divide the trunk.  There was good  hemostasis.  The minor fissure was completed with sequential firings of the Echelon stapler again using gold cartridges.  Nodes adjacent to the bronchus were removed and sent as separate specimens.  The Echelon  stapler with a green cartridge was placed across the base of the right upper lobe bronchus at its origin and closed.  A test inflation showed good reinflation of the lower and middle lobes.  The stapler then was fired, transecting the bronchus.  The  right upper lobe was placed into an endoscopic retrieval bag, removed and sent for frozen section of the nodules and the bronchial margin.  Both nodules turned out to be nonsmall cell carcinomas.  The bronchial margin was free of tumor.  The chest was  copiously irrigated with warm saline.  A test inflation to 30 cm of water revealed no leakage from the bronchial stump or the staple lines.  The middle lobe was tacked to the lower lobe using the Echelon stapler and a gold cartridge.  Level 4R and 2R  nodes were removed and sent for pathology.  Inspection of the middle lobe showed that it still seemed to want to a twist slightly, so a second staple line was fired attaching the more anterior portion of the middle lobe to the lower lobe.  Final  inspection was made for hemostasis.  There was no ongoing bleeding.  A 28-French chest tube was placed through the more anterior port incision and secured with #1 silk suture that was directed to the apex.  Dual-lung ventilation was resumed.  The  remaining port incision was closed with a subcuticular suture.  The working incision was closed in 3 layers in standard fashion.  Dermabond was applied.  The chest tube was placed to suction.  The patient was placed back in the supine position.  She then  was extubated in the operating room and taken to the [facility] in good condition.  TN/NUANCE  D:08/23/2018 T:08/23/2018 JOB:[id number]

## 2018-08-23 NOTE — Brief Op Note (Addendum)
08/23/2018  11:36 AM  PATIENT:  Cindy Butler  68 y.o. female  PRE-OPERATIVE DIAGNOSIS:  RUL LUNG NODULES  POST-OPERATIVE DIAGNOSIS:  Non small cancer right upper lobe- Clinical stage IIB(T3N0)  PROCEDURE:  Right Video Assisted Thoracoscopy (VATS), Thoracoscopic right upper lobectomy, Lymph node Dissection, Intercostal nerve block   SURGEON:  Surgeon(s) and Role:    Melrose Nakayama, MD - Primary  PHYSICIAN ASSISTANT: Lars Pinks PA-C  ANESTHESIA:   general  EBL:  400 mL   BLOOD ADMINISTERED:none  DRAINS: 28 French chest tube placed in the right pleural space   LOCAL MEDICATIONS USED:   Liposomal and 0.5% bupivacaine  SPECIMEN:  Source of Specimen:  RUL, multiple lymph nodes  DISPOSITION OF SPECIMEN:  Pathology. Frozen section showed non small cancer RUL and bronchial margin negative for cancer  COUNTS CORRECT:  YES  DICTATION:- done  PLAN OF CARE: Admit to inpatient   PATIENT DISPOSITION:  PACU - hemodynamically stable.   Delay start of Pharmacological VTE agent (>24hrs) due to surgical blood loss or risk of bleeding: no

## 2018-08-24 ENCOUNTER — Encounter (HOSPITAL_COMMUNITY): Payer: Self-pay | Admitting: Thoracic Surgery (Cardiothoracic Vascular Surgery)

## 2018-08-24 ENCOUNTER — Inpatient Hospital Stay (HOSPITAL_COMMUNITY): Payer: Medicare Other

## 2018-08-24 LAB — BASIC METABOLIC PANEL
Anion gap: 9 (ref 5–15)
BUN: 12 mg/dL (ref 8–23)
CO2: 22 mmol/L (ref 22–32)
Calcium: 8.3 mg/dL — ABNORMAL LOW (ref 8.9–10.3)
Chloride: 107 mmol/L (ref 98–111)
Creatinine, Ser: 1.07 mg/dL — ABNORMAL HIGH (ref 0.44–1.00)
GFR calc Af Amer: >60 mL/min (ref >60)
GFR calc non Af Amer: 53 mL/min — ABNORMAL LOW (ref >60)
Glucose, Bld: 133 mg/dL — ABNORMAL HIGH (ref 70–99)
Potassium: 4.5 mmol/L (ref 3.5–5.1)
Sodium: 138 mmol/L (ref 135–145)

## 2018-08-24 LAB — GLUCOSE, CAPILLARY
Glucose-Capillary: 114 mg/dL — ABNORMAL HIGH (ref 70–99)
Glucose-Capillary: 111 mg/dL — ABNORMAL HIGH (ref 70–99)
Glucose-Capillary: 123 mg/dL — ABNORMAL HIGH (ref 70–99)

## 2018-08-24 LAB — CBC
HCT: 32.9 % — ABNORMAL LOW (ref 36.0–46.0)
Hemoglobin: 11 g/dL — ABNORMAL LOW (ref 12.0–15.0)
MCH: 32.3 pg (ref 26.0–34.0)
MCHC: 33.4 g/dL (ref 30.0–36.0)
MCV: 96.5 fL (ref 80.0–100.0)
Platelets: 122 10*3/uL — ABNORMAL LOW (ref 150–400)
RBC: 3.41 MIL/uL — ABNORMAL LOW (ref 3.87–5.11)
RDW: 13.1 % (ref 11.5–15.5)
WBC: 7.6 10*3/uL (ref 4.0–10.5)
nRBC: 0 % (ref 0.0–0.2)

## 2018-08-24 MED ORDER — SODIUM CHLORIDE 0.45 % IV SOLN
INTRAVENOUS | Status: DC
  Administered 2018-08-24 – 2018-08-28 (×2): via INTRAVENOUS

## 2018-08-24 NOTE — Plan of Care (Signed)
  Problem: Education: Goal: Knowledge of disease or condition will improve 08/24/2018 2036 by Lubertha South, RN Outcome: Progressing Problem: Activity: Goal: Risk for activity intolerance will decrease 08/24/2018 2036 by Lubertha South, RN Outcome: Progressing Problem: Respiratory: Goal: Respiratory status will improve 08/24/2018 2036 by Lubertha South, RN Outcome: Progressing Problem: Pain Management: Goal: Pain level will decrease 08/24/2018 2036 by Lubertha South, RN Outcome: Progressing Problem: Clinical Measurements: Goal: Diagnostic test results will improve 08/24/2018 2036 by Lubertha South, RN Outcome: Progressing Problem: Clinical Measurements: Goal: Respiratory complications will improve 08/24/2018 2036 by Lubertha South, RN Outcome: Progressing Problem: Activity: Goal: Risk for activity intolerance will decrease 08/24/2018 2036 by Lubertha South, RN Outcome: Progressing Problem: Nutrition: Goal: Adequate nutrition will be maintained 08/24/2018 2036 by Lubertha South, RN Outcome: Progressing Problem: Coping: Goal: Level of anxiety will decrease 08/24/2018 2036 by Lubertha South, RN Outcome: Progressing Problem: Pain Managment: Goal: General experience of comfort will improve 08/24/2018 2036 by Lubertha South, RN Outcome: Progressing

## 2018-08-24 NOTE — Plan of Care (Deleted)
  Problem: Education: Goal: Knowledge of disease or condition will improve 08/24/2018 2034 by Lubertha South, RN Outcome: Progressing 08/24/2018 2034 by Lubertha South, RN Outcome: Not Progressing   Problem: Education: Goal: Knowledge of the prescribed therapeutic regimen will improve 08/24/2018 2034 by Lubertha South, RN Outcome: Progressing 08/24/2018 2034 by Lubertha South, RN Outcome: Not Progressing   Problem: Activity: Goal: Risk for activity intolerance will decrease 08/24/2018 2034 by Lubertha South, RN Outcome: Progressing 08/24/2018 2034 by Lubertha South, RN Outcome: Not Progressing   Problem: Respiratory: Goal: Respiratory status will improve 08/24/2018 2034 by Lubertha South, RN Outcome: Progressing 08/24/2018 2034 by Lubertha South, RN Outcome: Not Progressing   Problem: Pain Management: Goal: Pain level will decrease 08/24/2018 2034 by Lubertha South, RN Outcome: Progressing 08/24/2018 2034 by Lubertha South, RN Outcome: Not Progressing   Problem: Nutrition: Goal: Adequate nutrition will be maintained 08/24/2018 2034 by Lubertha South, RN Outcome: Progressing 08/24/2018 2034 by Lubertha South, RN Outcome: Not Progressing   Problem: Coping: Goal: Level of anxiety will decrease 08/24/2018 2034 by Lubertha South, RN Outcome: Progressing 08/24/2018 2034 by Lubertha South, RN Outcome: Not Progressing   Problem: Pain Managment: Goal: General experience of comfort will improve 08/24/2018 2034 by Lubertha South, RN Outcome: Progressing 08/24/2018 2034 by Lubertha South, RN Outcome: Not Progressing   Problem: Safety: Goal: Ability to remain free from injury will improve 08/24/2018 2034 by Lubertha South, RN Outcome: Progressing 08/24/2018 2034 by Lubertha South, RN Outcome: Not Progressing

## 2018-08-24 NOTE — Progress Notes (Signed)
      [address]       [place] [address]             [phone]      1 Day Post-Op Procedure(s) (LRB): Right Video Assisted Thoracoscopy (Vats) with Right Upper Lobectomy and Node Dissection (Right)   Subjective:  No complaints.  States doing pretty well.  She states her pain is well controlled.  Denies N/V.   Wants her foley out.  Also ask if we can remove 1 IV.  Objective: Vital signs in last 24 hours: Temp:  [97 F (36.1 C)-98.3 F (36.8 C)] 98.3 F (36.8 C) (08/15 0736) Pulse Rate:  [55-76] 74 (08/15 0331) Cardiac Rhythm: Normal sinus rhythm (08/15 0700) Resp:  [12-24] 20 (08/15 0800) BP: (93-130)/(46-81) 107/48 (08/15 0736) SpO2:  [97 %-100 %] 99 % (08/15 0800) Arterial Line BP: (111-123)/(46-58) 116/48 (08/14 1315)  Intake/Output from previous day: 08/14 0701 - 08/15 0700 In: 1969.7 [P.O.:15; I.V.:1954.7] Out: 2340 [Urine:1510; Blood:400; Chest Tube:430] Intake/Output this shift: Total I/O In: 240 [P.O.:240] Out: -   General appearance: alert, cooperative and no distress Heart: regular rate and rhythm Lungs: clear to auscultation bilaterally Abdomen: soft, non-tender; bowel sounds normal; no masses,  no organomegaly Extremities: extremities normal, atraumatic, no cyanosis or edema Wound: clean and dry  Lab Results: Recent Labs    08/24/18 0144  WBC 7.6  HGB 11.0*  HCT 32.9*  PLT 122*   BMET:  Recent Labs    08/24/18 0144  NA 138  K 4.5  CL 107  CO2 22  GLUCOSE 133*  BUN 12  CREATININE 1.07*  CALCIUM 8.3*    PT/INR: No results for input(s): LABPROT, INR in the last 72 hours. ABG    Component Value Date/Time   PHART 7.423 08/20/2018 1103   HCO3 23.6 08/20/2018 1103   ACIDBASEDEF 0.3 08/20/2018 1103   O2SAT 97.1 08/20/2018 1103   CBG (last 3)  Recent Labs    08/23/18 2339 08/24/18 0617  GLUCAP 138* 114*    Assessment/Plan: S/P Procedure(s) (LRB): Right Video Assisted Thoracoscopy (Vats) with Right Upper  Lobectomy and Node Dissection (Right)  1. CV- hemodynamically stable in NSR 2. Pulm- CT with 1+ air leak, 430 cc output since surgery, CXR with right sided pneumothorax/space... leave chest tube on suction today 3. D/C Foley Catheter 4. IV fluids to KVO 5. Renal- creatinine stable, lytes ok 6. CBGs controlled, patient not a diabetic, will d/c SSIP, cbgs 7. Dispo- patient stable, leave chest tube to suction today, repeat CXR in AM, Lovenox for DVT prophylaxis, d/c foley, IV fluids to KVO   LOS: 1 day    Ellwood Handler 08/24/2018

## 2018-08-25 ENCOUNTER — Inpatient Hospital Stay (HOSPITAL_COMMUNITY): Payer: Medicare Other

## 2018-08-25 LAB — COMPREHENSIVE METABOLIC PANEL
ALT: 26 U/L (ref 0–44)
AST: 35 U/L (ref 15–41)
Albumin: 3.1 g/dL — ABNORMAL LOW (ref 3.5–5.0)
Alkaline Phosphatase: 60 U/L (ref 38–126)
Anion gap: 8 (ref 5–15)
BUN: 13 mg/dL (ref 8–23)
CO2: 21 mmol/L — ABNORMAL LOW (ref 22–32)
Calcium: 8.5 mg/dL — ABNORMAL LOW (ref 8.9–10.3)
Chloride: 109 mmol/L (ref 98–111)
Creatinine, Ser: 1.02 mg/dL — ABNORMAL HIGH (ref 0.44–1.00)
GFR calc Af Amer: >60 mL/min (ref >60)
GFR calc non Af Amer: 56 mL/min — ABNORMAL LOW (ref >60)
Glucose, Bld: 121 mg/dL — ABNORMAL HIGH (ref 70–99)
Potassium: 4.3 mmol/L (ref 3.5–5.1)
Sodium: 138 mmol/L (ref 135–145)
Total Bilirubin: 0.7 mg/dL (ref 0.3–1.2)
Total Protein: 5.5 g/dL — ABNORMAL LOW (ref 6.5–8.1)

## 2018-08-25 LAB — CBC
HCT: 31 % — ABNORMAL LOW (ref 36.0–46.0)
Hemoglobin: 10.2 g/dL — ABNORMAL LOW (ref 12.0–15.0)
MCH: 32.6 pg (ref 26.0–34.0)
MCHC: 32.9 g/dL (ref 30.0–36.0)
MCV: 99 fL (ref 80.0–100.0)
Platelets: 114 10*3/uL — ABNORMAL LOW (ref 150–400)
RBC: 3.13 MIL/uL — ABNORMAL LOW (ref 3.87–5.11)
RDW: 13.6 % (ref 11.5–15.5)
WBC: 6.2 10*3/uL (ref 4.0–10.5)
nRBC: 0 % (ref 0.0–0.2)

## 2018-08-25 LAB — GLUCOSE, CAPILLARY
Glucose-Capillary: 90 mg/dL (ref 70–99)
Glucose-Capillary: 99 mg/dL (ref 70–99)
Glucose-Capillary: 88 mg/dL (ref 70–99)

## 2018-08-25 MED ORDER — AMIODARONE HCL IN DEXTROSE 360-4.14 MG/200ML-% IV SOLN
30.0000 mg/h | INTRAVENOUS | Status: DC
  Filled 2018-08-25: qty 200

## 2018-08-25 MED ORDER — AMIODARONE LOAD VIA INFUSION
150.0000 mg | Freq: Once | INTRAVENOUS | Status: AC
  Administered 2018-08-25: 150 mg via INTRAVENOUS
  Filled 2018-08-25: qty 83.34

## 2018-08-25 MED ORDER — METOCLOPRAMIDE HCL 5 MG/ML IJ SOLN
10.0000 mg | Freq: Four times a day (QID) | INTRAMUSCULAR | Status: AC
  Administered 2018-08-25 – 2018-08-26 (×4): 10 mg via INTRAVENOUS
  Filled 2018-08-25 (×4): qty 2

## 2018-08-25 MED ORDER — GUAIFENESIN ER 600 MG PO TB12
1200.0000 mg | ORAL_TABLET | Freq: Two times a day (BID) | ORAL | Status: DC
  Administered 2018-08-25 – 2018-08-30 (×10): 1200 mg via ORAL
  Filled 2018-08-25 (×10): qty 2

## 2018-08-25 MED ORDER — AMIODARONE HCL IN DEXTROSE 360-4.14 MG/200ML-% IV SOLN
60.0000 mg/h | INTRAVENOUS | Status: DC
  Administered 2018-08-26: 01:00:00 60 mg/h via INTRAVENOUS
  Filled 2018-08-25: qty 200

## 2018-08-25 NOTE — Plan of Care (Signed)
  Problem: Education: Goal: Knowledge of disease or condition will improve Outcome: Progressing   Problem: Activity: Goal: Risk for activity intolerance will decrease Outcome: Progressing   Problem: Cardiac: Goal: Will achieve and/or maintain hemodynamic stability Outcome: Progressing   Problem: Clinical Measurements: Goal: Postoperative complications will be avoided or minimized Outcome: Progressing   Problem: Respiratory: Goal: Respiratory status will improve Outcome: Progressing   Problem: Pain Management: Goal: Pain level will decrease Outcome: Progressing   Problem: Skin Integrity: Goal: Wound healing without signs and symptoms infection will improve Outcome: Progressing   Problem: Education: Goal: Knowledge of General Education information will improve Description: Including pain rating scale, medication(s)/side effects and non-pharmacologic comfort measures Outcome: Progressing

## 2018-08-25 NOTE — Progress Notes (Signed)
      [address]       [place] [address]             [phone]      2 Days Post-Op Procedure(s) (LRB): Right Video Assisted Thoracoscopy (Vats) with Right Upper Lobectomy and Node Dissection (Right)   Subjective:  Patient is a having a little more pain this morning.  She has started to cough up some blood tinged sputum.  Nauseated at times, passing gas, but no BM  Objective: Vital signs in last 24 hours: Temp:  [97.8 F (36.6 C)-98.5 F (36.9 C)] 98.4 F (36.9 C) (08/16 0737) Pulse Rate:  [64-72] 72 (08/16 0100) Cardiac Rhythm: Normal sinus rhythm (08/16 0357) Resp:  [14-25] 16 (08/16 0357) BP: (100-125)/(43-75) 116/43 (08/16 0100) SpO2:  [99 %-100 %] 100 % (08/16 0357)  Intake/Output from previous day: 08/15 0701 - 08/16 0700 In: 1169.2 [P.O.:1000; I.V.:169.2] Out: 1110 [Urine:700; Chest Tube:410] Intake/Output this shift: Total I/O In: 220 [P.O.:220] Out: 140 [Urine:100; Chest Tube:40]  General appearance: alert, cooperative and no distress Heart: regular rate and rhythm Lungs: clear to auscultation bilaterally Abdomen: soft, non-tender; bowel sounds normal; no masses,  no organomegaly Extremities: extremities normal, atraumatic, no cyanosis or edema Wound: clean and dry  Lab Results: Recent Labs    08/24/18 0144 08/25/18 0248  WBC 7.6 6.2  HGB 11.0* 10.2*  HCT 32.9* 31.0*  PLT 122* 114*   BMET:  Recent Labs    08/24/18 0144 08/25/18 0248  NA 138 138  K 4.5 4.3  CL 107 109  CO2 22 21*  GLUCOSE 133* 121*  BUN 12 13  CREATININE 1.07* 1.02*  CALCIUM 8.3* 8.5*    PT/INR: No results for input(s): LABPROT, INR in the last 72 hours. ABG    Component Value Date/Time   PHART 7.423 08/20/2018 1103   HCO3 23.6 08/20/2018 1103   ACIDBASEDEF 0.3 08/20/2018 1103   O2SAT 97.1 08/20/2018 1103   CBG (last 3)  Recent Labs    08/24/18 1127 08/24/18 1707 08/25/18 0619  GLUCAP 111* 123* 90    Assessment/Plan: S/P Procedure(s)  (LRB): Right Video Assisted Thoracoscopy (Vats) with Right Upper Lobectomy and Node Dissection (Right)  1. Chest tube- +1 air leak, resolves after initial cough, 410 cc output--- CXR shows an increase in right sided pneumothorax, CT is in good position, dressing replaced... leave tube on suction today 2. CV- hemodynamically stable in NSR 3. Renal- creatinine, lytes okay 4. GI- nausea has zofran prn, will add reglan prn 5. Dispo- patient stable, unsure why her pneumothorax increased, she has a 1+ air leak that clears after coughing, CT is in place, new dressing applied, will leave chest tube on suction today, repeat CXR in AM   LOS: 2 days    Ellwood Handler 08/25/2018

## 2018-08-26 ENCOUNTER — Inpatient Hospital Stay (HOSPITAL_COMMUNITY): Payer: Medicare Other

## 2018-08-26 MED ORDER — AMIODARONE HCL 200 MG PO TABS
200.0000 mg | ORAL_TABLET | Freq: Two times a day (BID) | ORAL | Status: DC
  Administered 2018-08-26 – 2018-08-28 (×5): 200 mg via ORAL
  Filled 2018-08-26 (×5): qty 1

## 2018-08-26 NOTE — Progress Notes (Addendum)
      [address]       [place] [address]             [phone]       3 Days Post-Op Procedure(s) (LRB): Right Video Assisted Thoracoscopy (Vats) with Right Upper Lobectomy and Node Dissection (Right)  Subjective: Patient states pain under good control. She has not eaten (no nausea, vomiting, or abdominal pain) much as she does not like the food.  Objective: Vital signs in last 24 hours: Temp:  [98.3 F (36.8 C)-98.9 F (37.2 C)] 98.5 F (36.9 C) (08/17 0427) Pulse Rate:  [68-118] 118 (08/17 0427) Cardiac Rhythm: Normal sinus rhythm (08/17 0443) Resp:  [15-20] 20 (08/17 0431) BP: (113-158)/(61-63) 113/61 (08/17 0427) SpO2:  [94 %-96 %] 96 % (08/17 0431)      Intake/Output from previous day: 08/16 0701 - 08/17 0700 In: 1421.7 [P.O.:905; I.V.:516.7] Out: 1890 [Urine:1500; Chest Tube:390]   Physical Exam:  Cardiovascular: RRR Pulmonary: Clear to auscultation bilaterally Abdomen: Soft, non tender, bowel sounds present. Extremities: No lower extremity edema. Wound: Clean and dry.  No erythema or signs of infection. Chest Tube: to suction, +1 air leak with cough  Lab Results: CBC: Recent Labs    08/24/18 0144 08/25/18 0248  WBC 7.6 6.2  HGB 11.0* 10.2*  HCT 32.9* 31.0*  PLT 122* 114*   BMET:  Recent Labs    08/24/18 0144 08/25/18 0248  NA 138 138  K 4.5 4.3  CL 107 109  CO2 22 21*  GLUCOSE 133* 121*  BUN 12 13  CREATININE 1.07* 1.02*  CALCIUM 8.3* 8.5*    PT/INR: No results for input(s): LABPROT, INR in the last 72 hours. ABG:  INR: Will add last result for INR, ABG once components are confirmed Will add last 4 CBG results once components are confirmed  Assessment/Plan:  1. CV - She went into a fib after 9 pm last evening.  She is on an Amiodarone drip. She is SR in the 60's this am. Will stop Amiodarone drip and give low dose oral after 11 am. 2.  Pulmonary - On. Chest tube with 390 cc of output last 24 hours. Chest tube  is to suction and there is a +1 air leak with cough. CXR this am appears to show a right apical pneumothorax (? If slightly increased), right base atelectasis. Will discuss with surgeon if should increase suction. Encourage incentive spirometer. Await final pathology. 3. Expected ABL anemia-H and H yesterday 10.2 and 31. Continue oral ferrous 4. I spoke with patient about having husband bring in food for her to eat. She also has some snacks, which I encouraged her to eat.   Cindy M ZimmermanPA-C 08/26/2018,7:19 AM [phone]   Chart reviewed, patient examined, agree with above. CXR looks ok to me. She has an apical space but it looks about the same as yesterday although radiology thought it was larger. I don't see any air leak at this time with coughing. Will keep on 20 cm suction and reevaluate later today. May consider putting to water seal later if I still don't see a leak. Back in sinus on IV amio. Switching to po later this am. Pain well controlled. Asking about pathology, but still pending.

## 2018-08-26 NOTE — Plan of Care (Signed)
  Problem: Education: Goal: Knowledge of disease or condition will improve Outcome: Progressing   Problem: Cardiac: Goal: Will achieve and/or maintain hemodynamic stability Outcome: Progressing   Problem: Respiratory: Goal: Respiratory status will improve Outcome: Progressing

## 2018-08-26 NOTE — Care Management Important Message (Signed)
Important Message  Patient Details  Name: Cindy Butler MRN: [id number] Date of Birth: 1950-10-02   Medicare Important Message Given:  Yes     Shelda Altes 08/26/2018, 2:39 PM

## 2018-08-26 NOTE — Progress Notes (Signed)
I returned to see patient. She is sitting in the chair talking with her husband and in sinus rhythm. She has no complaints at this time. I evaluated the chest tube again. There is no air leak (+1, intermittent earlier). As discussed with Dr. Cyndia Bent, will place to water seal. Check CXR in am.

## 2018-08-27 ENCOUNTER — Inpatient Hospital Stay (HOSPITAL_COMMUNITY): Payer: Medicare Other

## 2018-08-27 NOTE — Progress Notes (Addendum)
      [address]       [place] [address]             [phone]       4 Days Post-Op Procedure(s) (LRB): Right Video Assisted Thoracoscopy (Vats) with Right Upper Lobectomy and Node Dissection (Right)  Subjective: Patient states pain under good control. She is not using PCA much, but will leave just in case. She had a "good" bowel movement yesterday. She states her breathing is pretty good.  Objective: Vital signs in last 24 hours: Temp:  [98.1 F (36.7 C)-98.5 F (36.9 C)] 98.3 F (36.8 C) (08/18 0411) Pulse Rate:  [57-63] 63 (08/17 1632) Cardiac Rhythm: Normal sinus rhythm (08/17 2100) Resp:  [2-19] 17 (08/18 0411) BP: (112-137)/(48-67) 112/67 (08/18 0411) SpO2:  [96 %-100 %] 100 % (08/18 0411)      Intake/Output from previous day: 08/17 0701 - 08/18 0700 In: 480 [P.O.:480] Out: 910 [Urine:800; Chest Tube:110]   Physical Exam:  Cardiovascular: RRR Pulmonary: Clear to auscultation bilaterally;slightly diminished right apex Abdomen: Soft, non tender, bowel sounds present. Extremities: No lower extremity edema. Wound: Clean and dry.  No erythema or signs of infection. Chest Tube: to water seal, tidling but no air leak with cough  Lab Results: CBC: Recent Labs    08/25/18 0248  WBC 6.2  HGB 10.2*  HCT 31.0*  PLT 114*   BMET:  Recent Labs    08/25/18 0248  NA 138  K 4.3  CL 109  CO2 21*  GLUCOSE 121*  BUN 13  CREATININE 1.02*  CALCIUM 8.5*    PT/INR: No results for input(s): LABPROT, INR in the last 72 hours. ABG:  INR: Will add last result for INR, ABG once components are confirmed Will add last 4 CBG results once components are confirmed  Assessment/Plan:  1. CV - She went into a fib Sunday evening. Has been maintaining SR, SB at times.  She is on an Amiodarone 200 mg bid. 2.  Pulmonary - On. Chest tube with 110 cc of output last 24 hours. Chest tube is to water seal and there is a +1 air leak with cough. CXR this am  appears to show an increase in right apical pneumothorax right base atelectasis. Will place back to suction. Encourage incentive spirometer. Final pathology: ADENOCARCINOMA, TWO TUMORS, 1.3 AND 0.7 CM. - MARGINS NOT INVOLVED.:pT1b (multifocal), pN0 3. Expected ABL anemia-H and H yesterday 10.2 and 31. Continue oral ferrous   Donielle M ZimmermanPA-C 08/27/2018,7:12 AM [phone]   Chart reviewed, patient examined, agree with above. CXR this am shows slightly larger ptx/space at top of right hemithorax. Put back on suction. This afternoon I don't see any air leak with a lot of coughing, minimal tidaling. Will keep to suction today and reassess CXR in am.  Path reported discussed with patient. She is relieved that all lymph nodes and resection margins were negative.

## 2018-08-28 ENCOUNTER — Inpatient Hospital Stay (HOSPITAL_COMMUNITY): Payer: Medicare Other

## 2018-08-28 MED ORDER — AMIODARONE HCL IN DEXTROSE 360-4.14 MG/200ML-% IV SOLN
60.0000 mg/h | INTRAVENOUS | Status: AC
  Administered 2018-08-28: 09:00:00 60 mg/h via INTRAVENOUS
  Filled 2018-08-28: qty 200

## 2018-08-28 MED ORDER — METOPROLOL TARTRATE 5 MG/5ML IV SOLN
2.5000 mg | Freq: Once | INTRAVENOUS | Status: AC
  Administered 2018-08-28: 2.5 mg via INTRAVENOUS
  Filled 2018-08-28: qty 5

## 2018-08-28 MED ORDER — AMIODARONE HCL 200 MG PO TABS: 400.0000 mg | ORAL_TABLET | Freq: Two times a day (BID) | ORAL | Status: DC

## 2018-08-28 MED ORDER — METOPROLOL TARTRATE 12.5 MG HALF TABLET: 12.5000 mg | ORAL_TABLET | Freq: Two times a day (BID) | ORAL | Status: DC

## 2018-08-28 MED ORDER — AMIODARONE IV BOLUS ONLY 150 MG/100ML
150.0000 mg | Freq: Once | INTRAVENOUS | Status: AC
  Administered 2018-08-28: 150 mg via INTRAVENOUS
  Filled 2018-08-28: qty 100

## 2018-08-28 MED ORDER — AMIODARONE HCL IN DEXTROSE 360-4.14 MG/200ML-% IV SOLN
30.0000 mg/h | INTRAVENOUS | Status: DC
  Administered 2018-08-28 – 2018-08-29 (×2): 30 mg/h via INTRAVENOUS
  Filled 2018-08-28 (×3): qty 200

## 2018-08-28 NOTE — Progress Notes (Addendum)
      [address]       [place] [address]             [phone]       5 Days Post-Op Procedure(s) (LRB): Right Video Assisted Thoracoscopy (Vats) with Right Upper Lobectomy and Node Dissection (Right)  Subjective: Patient without specific complaints this am. She hopes chest tube comes out soon.  Objective: Vital signs in last 24 hours: Temp:  [98 F (36.7 C)-98.5 F (36.9 C)] 98.1 F (36.7 C) (08/19 0756) Pulse Rate:  [58-134] 91 (08/19 0756) Cardiac Rhythm: Atrial fibrillation (08/19 0704) Resp:  [15-24] 24 (08/19 0756) BP: (119-168)/(50-88) 134/77 (08/19 0756) SpO2:  [97 %-100 %] 98 % (08/19 0756) FiO2 (%):  [0 %] 0 % (08/18 1200)      Intake/Output from previous day: 08/18 0701 - 08/19 0700 In: 690 [P.O.:600; I.V.:90] Out: 2130 [Urine:1700; Chest Tube:430]   Physical Exam:  Cardiovascular: IRRR IRRR Pulmonary: Clear to auscultation bilaterally;slightly diminished right apex Abdomen: Soft, non tender, bowel sounds present. Extremities: No lower extremity edema. Wound: Clean and dry.  No erythema or signs of infection. Chest Tube: to suction, tidling but no air leak with cough  Lab Results: CBC: No results for input(s): WBC, HGB, HCT, PLT in the last 72 hours. BMET:  No results for input(s): NA, K, CL, CO2, GLUCOSE, BUN, CREATININE, CALCIUM in the last 72 hours.  PT/INR: No results for input(s): LABPROT, INR in the last 72 hours. ABG:  INR: Will add last result for INR, ABG once components are confirmed Will add last 4 CBG results once components are confirmed  Assessment/Plan:  1. CV - She went into a fib Sunday evening. Was maintaining SR, SB at times until about 4:30 this am when she went back into a fib with RVR. She received an Amiodarone bolus. Will give Lopressor IV x one, start oral Lopressor 12.5 mg bid, and because in a fib with HR in the 140's, will restart Amiodarone drip. 2.  Pulmonary - On. Chest tube with 430 cc of  output last 24 hours. Chest tube is to suction and there is tidling with cough but no air leak.  CXR this am appears to show a a decrease in right apical pneumothorax, right base atelectasis. Hope to place to water seal soon. Encourage incentive spirometer.  3. Expected ABL anemia-H and H yesterday 10.2 and 31. Continue oral ferrous   Sharalyn Ink [facility] 08/28/2018,8:02 AM [phone]   Chart reviewed, patient examined, agree with above. There is no air leak with multiple hard coughs and mild tidaling. CXR shows persistent space at top of right hemithorax that looks a little smaller than yesterday. It is not clear to me if this is a ptx or just space due to lobectomy. Will put to water seal and observe. Recurrent atrial fib. Did not convert with amio bolus so put back on drip. On oral Lopressor.

## 2018-08-28 NOTE — Progress Notes (Signed)
Ambulated along the hallway  for about 400 feet tolerated well. No complaints presented. Heart rate still on the 50's sinus. PA aware with no order. To continue to monitor.

## 2018-08-28 NOTE — Progress Notes (Signed)
Patient's HR increased to 140-150's asymptomatic. EKG completed. Afib with RVR. Paged Dr. Channing Mutters. Received order for Amiodarone 150 mg bolus IV.

## 2018-08-28 NOTE — Progress Notes (Addendum)
Heart rate still on 140's to 150's a-fib  MD aware. Started on amiodarone gtt at 60 mg/hr and given lopressor iv. Asymptomatic. After 30 min, heart rate went down to  40's  Sinus brady.  PA made aware with order to decrease amiodarone to 30 mg/hr and not to give po lopressor. ekg done showed sinus brady. Continue to monitor.

## 2018-08-29 ENCOUNTER — Other Ambulatory Visit: Payer: Self-pay | Admitting: *Deleted

## 2018-08-29 ENCOUNTER — Inpatient Hospital Stay (HOSPITAL_COMMUNITY): Payer: Medicare Other

## 2018-08-29 LAB — BASIC METABOLIC PANEL
Anion gap: 9 (ref 5–15)
BUN: 9 mg/dL (ref 8–23)
CO2: 25 mmol/L (ref 22–32)
Calcium: 8.5 mg/dL — ABNORMAL LOW (ref 8.9–10.3)
Chloride: 105 mmol/L (ref 98–111)
Creatinine, Ser: 1.06 mg/dL — ABNORMAL HIGH (ref 0.44–1.00)
GFR calc Af Amer: >60 mL/min (ref >60)
GFR calc non Af Amer: 54 mL/min — ABNORMAL LOW (ref >60)
Glucose, Bld: 105 mg/dL — ABNORMAL HIGH (ref 70–99)
Potassium: 3.7 mmol/L (ref 3.5–5.1)
Sodium: 139 mmol/L (ref 135–145)

## 2018-08-29 MED ORDER — AMIODARONE IV BOLUS ONLY 150 MG/100ML
150.0000 mg | Freq: Once | INTRAVENOUS | Status: AC
  Administered 2018-08-29: 150 mg via INTRAVENOUS
  Filled 2018-08-29: qty 100

## 2018-08-29 MED ORDER — AMIODARONE HCL 200 MG PO TABS
200.0000 mg | ORAL_TABLET | Freq: Two times a day (BID) | ORAL | Status: DC
  Administered 2018-08-29 – 2018-08-30 (×3): 200 mg via ORAL
  Filled 2018-08-29 (×3): qty 1

## 2018-08-29 NOTE — Plan of Care (Signed)
  Problem: Cardiac: Goal: Will achieve and/or maintain hemodynamic stability Outcome: Progressing   Problem: Clinical Measurements: Goal: Postoperative complications will be avoided or minimized Outcome: Progressing   Problem: Respiratory: Goal: Respiratory status will improve Outcome: Progressing   Problem: Skin Integrity: Goal: Wound healing without signs and symptoms infection will improve Outcome: Progressing   Problem: Health Behavior/Discharge Planning: Goal: Ability to manage health-related needs will improve Outcome: Progressing   Problem: Clinical Measurements: Goal: Ability to maintain clinical measurements within normal limits will improve Outcome: Progressing Goal: Will remain free from infection Outcome: Progressing Goal: Diagnostic test results will improve Outcome: Progressing Goal: Respiratory complications will improve Outcome: Progressing Goal: Cardiovascular complication will be avoided Outcome: Progressing   Problem: Skin Integrity: Goal: Risk for impaired skin integrity will decrease Outcome: Progressing

## 2018-08-29 NOTE — TOC Progression Note (Signed)
Transition of Care [facility]) - Progression Note    Patient Details  Name: Cindy Butler MRN: [id number] Date of Birth: 1950-02-12  [facility] CM/SW Contact  Zenon Mayo, RN Phone Number: 08/29/2018, 7:16 PM  Clinical Narrative:    SP VATS, chest tube removed, for possible dc soon. NCM will continue to follow for dc TOC  Needs.        Expected Discharge Plan and Services                                                 Social Determinants of Health (SDOH) Interventions    Readmission Risk Interventions No flowsheet data found.

## 2018-08-29 NOTE — Progress Notes (Signed)
The proposed treatment discussed in cancer conference 08/29/2018 is for discussion purpose only and is not a binding recommendation.  The patient was not physically examined nor present for their treatment options.  Therefore, final treatment plans cannot be decided.

## 2018-08-29 NOTE — Progress Notes (Signed)
Pt back in A-fib. MD notified and verbal order received. Will continue to monitor.

## 2018-08-29 NOTE — Progress Notes (Addendum)
      [address]       [place] [address]             [phone]       6 Days Post-Op Procedure(s) (LRB): Right Video Assisted Thoracoscopy (Vats) with Right Upper Lobectomy and Node Dissection (Right)  Subjective: Patient without specific complaints this am. She hopes chest tube comes today.  Objective: Vital signs in last 24 hours: Temp:  [97.8 F (36.6 C)-98.9 F (37.2 C)] 97.8 F (36.6 C) (08/19 2332) Pulse Rate:  [52-91] 54 (08/19 2300) Cardiac Rhythm: Sinus bradycardia (08/20 0300) Resp:  [11-24] 16 (08/20 0400) BP: (120-144)/(54-77) 130/54 (08/19 2332) SpO2:  [94 %-100 %] 96 % (08/20 0400) FiO2 (%):  [0 %] 0 % (08/19 1204)      Intake/Output from previous day: 08/19 0701 - 08/20 0700 In: 549.7 [I.V.:549.7] Out: 620 [Urine:450; Chest Tube:170]   Physical Exam:  Cardiovascular: RRR Pulmonary: Clear to auscultation bilaterally;rub with chest tube in place on the right Abdomen: Soft, non tender, bowel sounds present. Extremities: No lower extremity edema. Wound: Clean and dry.  No erythema or signs of infection. Chest Tube: to water seal, tidling but no air leak with cough  Lab Results: CBC: No results for input(s): WBC, HGB, HCT, PLT in the last 72 hours. BMET:  Recent Labs    08/29/18 0224  NA 139  K 3.7  CL 105  CO2 25  GLUCOSE 105*  BUN 9  CREATININE 1.06*  CALCIUM 8.5*    PT/INR: No results for input(s): LABPROT, INR in the last 72 hours. ABG:  INR: Will add last result for INR, ABG once components are confirmed Will add last 4 CBG results once components are confirmed  Assessment/Plan:  1. CV - She went into a fib Sunday evening. Was maintaining SR, SB at times until about 4:30 yesterday am when she went back into a fib with RVR. She received an Amiodarone bolus, Lopressor IV x one,  and because in a fib with HR in the 140's, was restarted Amiodarone drip. Will start oral Amiodarone and stop Amiodarone drip. If goes  into a fib again, will need anticoagulation. 2.  Pulmonary - On. Chest tube with 170 cc of output last 24 hours. Chest tube is to water seal and there is tidling with cough but no air leak.  CXR this am appears stable (right apical space vs pneumothorax, right base atelectasis.) Hope to remove chest tube in am-later today vs am. Encourage incentive spirometer.  3. Expected ABL anemia-Last H and H  10.2 and 31. Continue oral ferrous   Donielle M ZimmermanPA-C 08/29/2018,7:13 AM [phone]    Chart reviewed, patient examined, agree with above. CXR shows stable right apical space and there is no air leak with heavy coughing for the past few days so will remove chest tube. Get CXR after removal as baseline and repeat 2V CXR in am. DC PCA.

## 2018-08-30 ENCOUNTER — Encounter (HOSPITAL_COMMUNITY): Payer: Self-pay | Admitting: Cardiology

## 2018-08-30 ENCOUNTER — Inpatient Hospital Stay (HOSPITAL_COMMUNITY): Payer: Medicare Other

## 2018-08-30 DIAGNOSIS — I2510 Atherosclerotic heart disease of native coronary artery without angina pectoris: Secondary | ICD-10-CM

## 2018-08-30 DIAGNOSIS — I4891 Unspecified atrial fibrillation: Secondary | ICD-10-CM

## 2018-08-30 DIAGNOSIS — Z720 Tobacco use: Secondary | ICD-10-CM

## 2018-08-30 DIAGNOSIS — I480 Paroxysmal atrial fibrillation: Secondary | ICD-10-CM

## 2018-08-30 LAB — ECHOCARDIOGRAM COMPLETE
Height: 65 in
Weight: 2752 oz

## 2018-08-30 LAB — TSH: TSH: 3.415 u[IU]/mL (ref 0.350–4.500)

## 2018-08-30 LAB — MAGNESIUM: Magnesium: 1.8 mg/dL (ref 1.7–2.4)

## 2018-08-30 MED ORDER — GUAIFENESIN ER 600 MG PO TB12: 1200.0000 mg | ORAL_TABLET | Freq: Two times a day (BID) | ORAL | Status: DC | PRN

## 2018-08-30 MED ORDER — TRAMADOL HCL 50 MG PO TABS: 50.0000 mg | ORAL_TABLET | Freq: Four times a day (QID) | ORAL | 0 refills | Status: DC | PRN

## 2018-08-30 MED ORDER — APIXABAN 5 MG PO TABS
5.0000 mg | ORAL_TABLET | Freq: Two times a day (BID) | ORAL | Status: DC
  Administered 2018-08-30: 12:00:00 5 mg via ORAL
  Filled 2018-08-30: qty 1

## 2018-08-30 MED ORDER — APIXABAN 5 MG PO TABS: 5.0000 mg | ORAL_TABLET | Freq: Two times a day (BID) | ORAL | 1 refills | Status: DC

## 2018-08-30 MED ORDER — AMIODARONE HCL 200 MG PO TABS: 200.0000 mg | ORAL_TABLET | Freq: Two times a day (BID) | ORAL | 1 refills | Status: DC

## 2018-08-30 MED FILL — ELIQUIS 5 MG TABLET: 5 | 30 days supply | Qty: 60 | Fill #0

## 2018-08-30 MED FILL — AMIODARONE HCL 200 MG TAB: 200 | 16 days supply | Qty: 30 | Fill #0

## 2018-08-30 MED FILL — traMADol HCL 50 MG TABS: 50 | 7 days supply | Qty: 28 | Fill #0

## 2018-08-30 NOTE — Progress Notes (Signed)
Echocardiogram 2D Echocardiogram has been performed.  Oneal Deputy Demita Tobia 08/30/2018, 12:08 PM

## 2018-08-30 NOTE — Care Management Important Message (Signed)
Important Message  Patient Details  Name: Cindy Butler MRN: [id number] Date of Birth: [birth year]   Medicare Important Message Given:  Yes     Shelda Altes 08/30/2018, 10:26 AM

## 2018-08-30 NOTE — Consult Note (Addendum)
Cardiology Consultation:   Patient ID: Cindy Butler MRN: [id number]; DOB: 15-Dec-1950  Admit date: 08/23/2018 Date of Consult: 08/30/2018  Primary Care Provider: Jake Samples, PA-C Primary Cardiologist: Kirk Ruths, MD at [facility]- wants to follow in [place]  Primary Electrophysiologist:  None    Patient Profile:   Cindy Butler is a 68 y.o. female with a hx of thrombocytopenia and RL nodule, HLD, GERD, COPD, and tobacco use who is being seen today for the evaluation of new onset atrial fibrillation at the request of Dr. Roxan Hockey.  History of Present Illness:   Cindy Butler had her routine physical with her PCP in May 2020. She was noted to have mild thrombocytopenia. She also had a low dose CT chest for lung cancer screening given significant tobacco use (since age 50, 30-55 pack year history). Right upper lobe nodule found and CT surgery was consulted. Of note, LAD atherosclerosis appreciated on CT chest (low dose). Decision was made to proceed with surgical resection, which was performed on 08/23/18 (RVATS with RU lobectomy). Unfortunately, her post-op course has been complicated by atrial fibrillation that started at approximately 9pm 08/25/18. Amiodarone drip was started and she converted to sinus rhythm in the 60s on 08/26/18. Amiodarone drip was transitioned to PO amodarone 200 mg BID. She went back into Afib RVR 08/28/18 4:30AM with ventricular rate 140-150s, She received amiodarone bolus 150 mg and 2.5 mg IV lopressor x 1 dose with plans for PO lopressor 12.5 mg BID and reinstatement of amiodarone drip.  After this IV  combination, pt converted to sinus bradycardia in the 40s. Amiodarone drip decreased to 30 mg/hr and PO lopressor held. Chest tube was removed yesterday. Unfortunately, pt went back into Afib RVR last evening, but easily  converted to sinus bradycardia with an additional amio bolus. Cardiology was asked to evaluate.   Today, she remains in sinus  rhythm-sinus bradycardia with heart rates in the 50-60s. She is generally unaware of her Afib, with some sensation of heart fluttering. No dizziness, SOB, or chest pain.   Telemetry review: Afib RVR with rates 100-150s: 8/16-8/17: [id number] 8/19: [id number] 8/20: [id number]   Heart Pathway Score:     Past Medical History:  Diagnosis Date   Esophageal reflux    Hyperlipidemia    Osteoporosis     Past Surgical History:  Procedure Laterality Date   COLONOSCOPY  09/27/2010   Procedure: COLONOSCOPY;  Surgeon: Jamesetta So;  Location: [facility];  Service: Gastroenterology;  Laterality: N/A;   NASAL SINUS SURGERY     TUBAL LIGATION     VIDEO ASSISTED THORACOSCOPY (VATS)/ LOBECTOMY Right 08/23/2018   Procedure: Right Video Assisted Thoracoscopy (Vats) with Right Upper Lobectomy and Node Dissection;  Surgeon: Melrose Nakayama, MD;  Location[facility];  Service: Thoracic;  Laterality: Right;     Home Medications:  Prior to Admission medications   Medication Sig Start Date End Date Taking? Authorizing Provider  aspirin EC 81 MG tablet Take 81 mg by mouth at bedtime.   Yes [provider]  Calcium Carb-Cholecalciferol (CALCIUM 600+D) 600-800 MG-UNIT TABS Take 1 tablet by mouth daily.   Yes [provider]  ferrous sulfate 325 (65 FE) MG tablet Take 325 mg by mouth daily with breakfast.   Yes [provider]  omeprazole (PRILOSEC) 40 MG capsule Take 40 mg by mouth daily.  05/27/18  Yes [provider]  Potassium 99 MG TABS Take 99 mg by mouth daily.   Yes [provider]  simvastatin (ZOCOR) 20 MG tablet Take 20 mg by mouth daily at 6 PM.  05/17/18  Yes [provider]  guaiFENesin (MUCINEX) 600 MG 12 hr tablet Take 2 tablets (1,200 mg total) by mouth 2 (two) times daily as needed for cough or to loosen phlegm. 08/30/18   Nani Skillern, PA-C    Inpatient Medications: Scheduled Meds:  amiodarone  200 mg Oral BID    aspirin EC  81 mg Oral QHS   bisacodyl  10 mg Oral Daily   enoxaparin (LOVENOX) injection  40 mg Subcutaneous Q24H   ferrous sulfate  325 mg Oral Q breakfast   guaiFENesin  1,200 mg Oral BID   pantoprazole  40 mg Oral Daily   senna-docusate  1 tablet Oral QHS   simvastatin  20 mg Oral q1800   Continuous Infusions:  PRN Meds: oxyCODONE, traMADol  Allergies:   No Known Allergies  Social History:   Social History   Socioeconomic History   Marital status: Married    Spouse name: Aaron Edelman   Number of children: 3   Years of education: Not on file   Highest education level: Not on file  Occupational History   Occupation: retired  Scientist, product/process development strain: Not hard at all   Food insecurity    Worry: Never true    Inability: Never true   Transportation needs    Medical: No    Non-medical: No  Tobacco Use   Smoking status: Former Smoker    Packs/day: 0.50    Years: 55.00    Pack years: 27.50    Types: Cigarettes    Quit date: 08/16/2018    Years since quitting: 0.0   Smokeless tobacco: Never Used  Substance and Sexual Activity   Alcohol use: Yes    Alcohol/week: 3.0 standard drinks    Types: 3 Standard drinks or equivalent per week   Drug use: Never   Sexual activity: Not on file  Lifestyle   Physical activity    Days per week: 0 days    Minutes per session: 0 min   Stress: Not at all  Relationships   Social connections    Talks on phone: Twice a week    Gets together: Once a week    Attends religious service: 1 to 4 times per year    Active member of club or organization: No    Attends meetings of clubs or organizations: Never    Relationship status: Married   Intimate partner violence    Fear of current or ex partner: No    Emotionally abused: No    Physically abused: No    Forced sexual activity: No  Other Topics Concern   Not on file  Social History Narrative   Not on file    Family History:    Family History    Problem Relation Age of Onset   Bone cancer Mother    Alcohol abuse Father    Melanoma Brother    Breast cancer Maternal Grandmother      ROS:  Please see the history of present illness.   All other ROS reviewed and negative.     Physical Exam/Data:   Vitals:   08/29/18 2344 08/30/18 0300 08/30/18 0416 08/30/18 0741  BP: 128/60  (!) 130/55 104/82  Pulse:      Resp: (!) 23 17 (!) 22 17  Temp: 98 F (36.7 C)  98 F (36.7 C) 98.4 F (36.9 C)  TempSrc: Oral  Oral Oral  SpO2: 100%     Weight:      Height:        Intake/Output Summary (Last 24 hours) at 08/30/2018 0859 Last data filed at 08/29/2018 2300 Gross per 24 hour  Intake 1305.01 ml  Output --  Net 1305.01 ml   Last 3 Weights 08/23/2018 08/20/2018 08/07/2018  Weight (lbs) 172 lb 172 lb 3.2 oz 170 lb  Weight (kg) 78.019 kg 78.109 kg 77.111 kg     Body mass index is 28.62 kg/m.  General:  Well nourished, well developed, in no acute distress HEENT: normal Neck: no JVD Vascular: + carotid bruit on right Cardiac:  Regular rhythm, bradycardic rate, no murmur Lungs:  clear to auscultation bilaterally, no wheezing, rhonchi or rales, VATS incisions look good, CT site covered Abd: soft, nontender, no hepatomegaly  Ext: no edema Musculoskeletal:  No deformities, BUE and BLE strength normal and equal Skin: warm and dry  Neuro:  CNs 2-12 intact, no focal abnormalities noted Psych:  Normal affect   EKG:  The EKG was personally reviewed and demonstrates:  Sinus bradycardia with HR 48 Telemetry:  Telemetry was personally reviewed and demonstrates:  Sinus to sinus bradycardia HR in the 50-60s  Relevant CV Studies:  Carotid artery [place] 08/12/18: IMPRESSION: Moderate to large amount of bilateral atherosclerotic plaque, right greater than left, likely progressed compared to the 05/2017 examination, though not resulting in a hemodynamically significant stenosis within either internal carotid artery.   Laboratory  Data:  High Sensitivity Troponin:  No results for input(s): TROPONINIHS in the last 720 hours.   Cardiac EnzymesNo results for input(s): TROPONINI in the last 168 hours. No results for input(s): TROPIPOC in the last 168 hours.  Chemistry Recent Labs  Lab 08/24/18 0144 08/25/18 0248 08/29/18 0224  NA 138 138 139  K 4.5 4.3 3.7  CL 107 109 105  CO2 22 21* 25  GLUCOSE 133* 121* 105*  BUN 12 13 9   CREATININE 1.07* 1.02* 1.06*  CALCIUM 8.3* 8.5* 8.5*  GFRNONAA 53* 56* 54*  GFRAA >60 >60 >60  ANIONGAP 9 8 9     Recent Labs  Lab 08/25/18 0248  PROT 5.5*  ALBUMIN 3.1*  AST 35  ALT 26  ALKPHOS 60  BILITOT 0.7   Hematology Recent Labs  Lab 08/24/18 0144 08/25/18 0248  WBC 7.6 6.2  RBC 3.41* 3.13*  HGB 11.0* 10.2*  HCT 32.9* 31.0*  MCV 96.5 99.0  MCH 32.3 32.6  MCHC 33.4 32.9  RDW 13.1 13.6  PLT 122* 114*   BNPNo results for input(s): BNP, PROBNP in the last 168 hours.  DDimer No results for input(s): DDIMER in the last 168 hours.   Radiology/Studies:  Dg Chest 2 View  Result Date: 08/30/2018 CLINICAL DATA:  Status post right lobectomy. EXAM: CHEST - 2 VIEW COMPARISON:  Radiograph of August 29, 2018. FINDINGS: Stable cardiomediastinal silhouette. Atherosclerosis of thoracic aorta is noted. Left lung is clear. Right-sided chest tube has been removed. Stable small right apical pneumothorax is noted. Minimal right basilar subsegmental atelectasis is noted. Stable subcutaneous emphysema is seen in the right lateral chest wall. Bony thorax is unremarkable. IMPRESSION: Stable small right apical pneumothorax is noted status post right-sided chest tube removal. Stable subcutaneous emphysema seen in right lateral chest wall. Aortic Atherosclerosis (ICD10-I70.0). Electronically Signed   By: Marijo Conception M.D.   On: 08/30/2018 08:00   Dg Chest Port 1 View  Result Date: 08/29/2018 CLINICAL DATA:  Chest tube, pneumothorax EXAM:  PORTABLE CHEST 1 VIEW COMPARISON:  08/28/2018  FINDINGS: Right chest tube has been placed. Right apical pneumothorax is stable, 10-15%. Postoperative changes in the right lung. Mild cardiomegaly. Left lung clear. IMPRESSION: Interval placement of right chest tube with stable 10-15% right pneumothorax. Electronically Signed   By: Rolm Baptise M.D.   On: 08/29/2018 10:29   Dg Chest Port 1 View  Result Date: 08/29/2018 CLINICAL DATA:  Right upper lobe lobectomy. EXAM: PORTABLE CHEST 1 VIEW COMPARISON:  08/28/2018 FINDINGS: Stable cardiomediastinal contours. Right-sided chest tube remains in place. Stable size of a small right apical pneumothorax. Left lung remains clear. Mild subcutaneous emphysema at the right lateral chest wall. IMPRESSION: Stable small right apical pneumothorax. Electronically Signed   By: Davina Poke M.D.   On: 08/29/2018 09:38   Dg Chest Port 1 View  Result Date: 08/28/2018 CLINICAL DATA:  Follow-up right chest tube EXAM: PORTABLE CHEST 1 VIEW COMPARISON:  08/27/2018 FINDINGS: Cardiac shadow is stable. Aortic calcifications are again seen. Right apically pneumothorax is again noted and stable. Right chest tube is again seen in satisfactory position. No focal infiltrate is noted. IMPRESSION: Stable right pneumothorax. Electronically Signed   By: Inez Catalina M.D.   On: 08/28/2018 07:56   Dg Chest Port 1 View  Result Date: 08/27/2018 CLINICAL DATA:  Shortness of breath, chest tube, pneumothorax EXAM: PORTABLE CHEST 1 VIEW COMPARISON:  08/26/2018 FINDINGS: Right chest tube remains in place, unchanged. Right side pneumothorax is stable. Patchy airspace disease in the right lung, likely atelectasis. Left lung clear. Cardiomegaly. No effusions or acute bony abnormality. IMPRESSION: Stable moderate-sized right pneumothorax with right chest tube remaining in stable position. Electronically Signed   By: Rolm Baptise M.D.   On: 08/27/2018 08:00    Assessment and Plan:   1. Atrial fibrillation RVR - new onset - pt has converted  between sinus - Afib RVR - sinus bradycardia three times (as listed above) - last night she easily converted with amiodarone bolus - she is now in sinus bradycardia in the 50s - she is generally unaware of her arrhythmia - reports only intermittent sensation of heart fluttering when in RVR - favor continuing PO load of amiodarone - she has received approximately 5 days of 200 mg BID amiodarone - however, amiodarone is not a great option long term for this patient given her tobacco use and recent lobectomy - Per CTS - OK for anticoagulation - This patients CHA2DS2-VASc Score and unadjusted Ischemic Stroke Rate (% per year) is equal to 2.2 % stroke rate/year from a score of 2 (age, female) - she has not received anticoagulation yet - recommend starting eliquis and obtain echocardiogram - would recommend observing for another 24 hr to make sure she maintains sinus rhythm before discharge - will check a TSH for completeness    2. RVATS with RU lobectomy 08/25/18 - CT removed yesterday - per CT surgery   3. Hyperlipidemia - on ASA and statin - recommend checking lipids   4. Thrombocytopenia 5. Acute blood loss anemia - s/p VATS - has been worked up - deficient in B12 - PLT 114, from baseline 142 - Hb 10.2, from baseline of 13, as expected for acute blood loss anemia following surgery - consider stopping ASA in the setting of NOAC   Will message for follow up in [place].       For questions or updates, please contact [place] Please consult [email or web address] for contact info under     Signed, Ledora Bottcher,  PA  08/30/2018 8:59 AM As above, patient seen and examined.  Briefly she is a 68 year old female with past medical history of COPD, hyperlipidemia who underwent resection of right upper lobe due to adenocarcinoma who we are asked to evaluate for postoperative atrial fibrillation.  Patient underwent right upper lobectomy August 14.  She has developed atrial  fibrillation postoperatively treated with amiodarone.  Cardiology now asked to evaluate.  Patient does have dyspnea with more vigorous activities but not routine activities.  No orthopnea, PND, pedal edema, chest pain or syncope.  She has had mild palpitations with her atrial fibrillation postoperatively but has not experienced this previously. Electrocardiogram showed atrial fibrillation with rapid ventricular response and nonspecific ST changes.  Follow-up showed sinus bradycardia.  Creatinine 1.06.  Platelet count 114.  1 postoperative atrial fibrillation-patient has converted to sinus rhythm.  Would continue amiodarone 200 mg twice daily for 1 week following discharge then decrease to 200 mg daily thereafter.  Chadsvasc is 3 for female sex, age greater than 73 and coronary artery disease.  Will treat with apixaban.  This is likely postoperative atrial fibrillation and would continue amiodarone for 12 weeks and then discontinue if no recurrences.  Check TSH and echocardiogram.  Patient could be discharged from a cardiac standpoint and follow-up in [place] as she is in sinus rhythm at present.  2 coronary artery disease-patient noted to have coronary calcification in LAD on recent CT scan.  We will not treat with aspirin given need for apixaban.  Continue statin.  3 tobacco abuse-patient counseled on discontinuing.  4 lung cancer status post resection-follow-up oncology.  Kirk Ruths

## 2018-08-30 NOTE — TOC Transition Note (Signed)
Transition of Care [facility]) - CM/SW Discharge Note   Patient Details  Name: Cindy Butler MRN: [id number] Date of Birth: 20-Sep-1950  [facility] CM/SW Contact:  Zenon Mayo, RN Phone Number: 08/30/2018, 11:02 AM   Clinical Narrative:    Patient for discharge today, no needs.    Final next level of care: Home/Self Care Barriers to Discharge: No Barriers Identified   Patient Goals and CMS Choice Patient states their goals for this hospitalization and ongoing recovery are:: get better   Choice offered to / list presented to : NA  Discharge Placement                       Discharge Plan and Services                DME Arranged: (NA)         HH Arranged: NA          Social Determinants of Health (SDOH) Interventions     Readmission Risk Interventions No flowsheet data found.

## 2018-08-30 NOTE — Progress Notes (Addendum)
      [address]       [place] [address]             [phone]       7 Days Post-Op Procedure(s) (LRB): Right Video Assisted Thoracoscopy (Vats) with Right Upper Lobectomy and Node Dissection (Right)  Subjective: Patient without specific complaints this am. She hopes to go home today.  Objective: Vital signs in last 24 hours: Temp:  [98 F (36.7 C)-98.3 F (36.8 C)] 98 F (36.7 C) (08/21 0416) Pulse Rate:  [52-64] 61 (08/20 1913) Cardiac Rhythm: Sinus bradycardia (08/21 0300) Resp:  [14-23] 22 (08/21 0416) BP: (124-141)/(50-60) 130/55 (08/21 0416) SpO2:  [95 %-100 %] 100 % (08/20 2344)     Intake/Output from previous day: 08/20 0701 - 08/21 0700 In: 1878.4 [P.O.:1540; I.V.:338.4] Out: -    Physical Exam:  Cardiovascular: RRR Pulmonary: Clear to auscultation bilaterally Abdomen: Soft, non tender, bowel sounds present. Extremities: No lower extremity edema. Wound: Clean and dry.  No erythema or signs of infection.   Lab Results: CBC: No results for input(s): WBC, HGB, HCT, PLT in the last 72 hours. BMET:  Recent Labs    08/29/18 0224  NA 139  K 3.7  CL 105  CO2 25  GLUCOSE 105*  BUN 9  CREATININE 1.06*  CALCIUM 8.5*    PT/INR: No results for input(s): LABPROT, INR in the last 72 hours. ABG:  INR: Will add last result for INR, ABG once components are confirmed Will add last 4 CBG results once components are confirmed  Assessment/Plan:  1. CV - PAF. She went back into a fib last evening and was given an Amiodarone bolus. SB in the 50's this am. On Amiodarone 200 mg bid. Will ask cards to evaluate and will need NOAC. 2.  Pulmonary - On room air. Chest tube removed yesterday. CXR this am appears stable (small right apical space vs pneumothorax)  Encourage incentive spirometer.  3. Expected ABL anemia-Last H and H  10.2 and 31. Continue oral ferrous 4. Likely discharge later today after seen by cardiology  Donielle M  ZimmermanPA-C 08/30/2018,7:16 AM [phone]   Chart reviewed, patient examined, agree with above. CXR looks good with stable right apical space.  She had recurrent atrial fib overnight and converted with amio bolus. She is sinus 55 now on oral amio. Cardiology consulted for management and follow up of atrial fib. I think this is related to lung surgery and should not be a long term problem for her but with recurrent episodes I think she needs cardiology follow up. She is POD 7 so I think she can be started on anticoagulation now. She is anxious to get home.

## 2018-08-30 NOTE — Discharge Instructions (Signed)
Thoracoscopy, Care After This sheet gives you information about how to care for yourself after your procedure. Your health care provider may also give you more specific instructions. If you have problems or questions, contact your health care provider. What can I expect after the procedure? After the procedure, it is common to have pain and soreness in the surgical area. Follow these instructions at home: Incision care   Follow instructions from your health care provider about how to take care of your incision. Make sure you: ? Wash your hands with soap and water before you change your bandage (dressing). If soap and water are not available, use hand sanitizer. ? Change your dressing as told by your health care provider. ? Leave stitches (sutures), skin glue, or adhesive strips in place. These skin closures may need to stay in place for 2 weeks or longer. If adhesive strip edges start to loosen and curl up, you may trim the loose edges. Do not remove adhesive strips completely unless your health care provider tells you to do that.  Check your incision areas every day for signs of infection. Check for: ? Redness, swelling, or pain. ? Fluid or blood. ? Warmth. ? Pus or a bad smell.  Do not take baths, swim, or use a hot tub until your health care provider approves. You may take showers. Medicines  Take over-the-counter and prescription medicines only as told by your health care provider.  If you were prescribed an antibiotic medicine, take it as told by your health care provider. Do not stop taking the antibiotic even if you start to feel better.  Do not drive or use heavy machinery while taking prescription pain medicine.  If you are taking prescription pain medicine, take actions to prevent or treat constipation. Your health care provider may recommend that you: ? Drink enough fluid to keep your urine pale yellow. ? Eat foods that are high in fiber, such as fresh fruits and vegetables,  whole grains, and beans. ? Limit foods that are high in fat and processed sugars, such as fried and sweet foods. ? Take an over-the-counter or prescription medicine for constipation. Managing pain, stiffness, and swelling   If directed, put ice on the affected area: ? Put ice in a plastic bag. ? Place a towel between your skin and the bag. ? Leave the ice on for 20 minutes, 2-3 times a day. Preventing lung infection  To prevent pneumonia and to keep your lungs healthy: ? Try to cough often. If it hurts to cough, hold a pillow against your chest as you cough. ? Take deep breaths or do breathing exercises as instructed by your health care provider. ? If you were given an incentive spirometer, use it as directed by your health care provider. General instructions  Do not lift anything that is heavier than 10 lb (4.5 kg), or the limit that you are told, until your health care provider says that it is safe.  Do not use any products that contain nicotine or tobacco, such as cigarettes and e-cigarettes. These can delay healing after surgery. If you need help quitting, ask your health care provider.  Avoid driving until your health care provider approves.  If you have a chest drainage tube, care for it as instructed by your health care provider. Do not travel by airplane after the chest drainage tube is removed until your health care provider approves.  Keep all follow-up visits as told by your health care provider. This is important. Contact  a health care provider if:  You have a fever.  Pain medicines do not ease your pain.  You have redness, swelling, or increasing pain in your incision area.  You develop a cough that does not go away, or you are coughing up mucus that is yellow or green. Get help right away if:  You have fluid, blood, or pus coming from your incision.  There is a bad smell coming from your incision or dressing.  You develop a rash.  You cough up blood.  You  develop light-headedness, or you feel faint.  You have difficulty breathing.  You develop chest pain.  Your heartbeat feels irregular or very fast. These symptoms may represent a serious problem that is an emergency. Do not wait to see if the symptoms will go away. Get medical help right away. Call your local emergency services (911 in the [place] Do not drive yourself to the hospital. Summary  Follow instructions from your health care provider about how to take care of your incision.  Do not drive or use heavy machinery while taking prescription pain medicine.  Leave stitches (sutures), skin glue, or adhesive strips in place.  Check your incision areas every day for signs of infection. This information is not intended to replace advice given to you by your health care provider. Make sure you discuss any questions you have with your health care provider. Document Released: 07/15/2004 Document Revised: 12/08/2016 Document Reviewed: 12/05/2016 Elsevier Patient Education  2020 [employer].  ------------------- Information on my medicine - ELIQUIS (apixaban)  Why was Eliquis prescribed for you? Eliquis was prescribed for you to reduce the risk of a blood clot forming that can cause a stroke if you have a medical condition called atrial fibrillation (a type of irregular heartbeat).  What do You need to know about Eliquis ? Take your Eliquis TWICE DAILY - one tablet in the morning and one tablet in the evening with or without food. If you have difficulty swallowing the tablet whole please discuss with your pharmacist how to take the medication safely.  Take Eliquis exactly as prescribed by your doctor and DO NOT stop taking Eliquis without talking to the doctor who prescribed the medication.  Stopping may increase your risk of developing a stroke.  Refill your prescription before you run out.  After discharge, you should have regular check-up appointments with your healthcare  provider that is prescribing your Eliquis.  In the future your dose may need to be changed if your kidney function or weight changes by a significant amount or as you get older.  What do you do if you miss a dose? If you miss a dose, take it as soon as you remember on the same day and resume taking twice daily.  Do not take more than one dose of ELIQUIS at the same time to make up a missed dose.  Important Safety Information A possible side effect of Eliquis is bleeding. You should call your healthcare provider right away if you experience any of the following: ? Bleeding from an injury or your nose that does not stop. ? Unusual colored urine (red or dark brown) or unusual colored stools (red or black). ? Unusual bruising for unknown reasons. ? A serious fall or if you hit your head (even if there is no bleeding).  Some medicines may interact with Eliquis and might increase your risk of bleeding or clotting while on Eliquis. To help avoid this, consult your healthcare provider or pharmacist prior  to using any new prescription or non-prescription medications, including herbals, vitamins, non-steroidal anti-inflammatory drugs (NSAIDs) and supplements.  This website has more information on Eliquis (apixaban): http://www.eliquis.com/eliquis/home

## 2018-08-30 NOTE — Progress Notes (Addendum)
ANTICOAGULATION CONSULT NOTE - Initial Consult  Pharmacy Consult for apixaban Indication: atrial fibrillation  No Known Allergies  Patient Measurements: Height: 5\' 5"  (165.1 cm) Weight: 172 lb (78 kg) IBW/kg (Calculated) : 57   Vital Signs: Temp: 98.4 F (36.9 C) (08/21 0741) Temp Source: Oral (08/21 0741) BP: 104/82 (08/21 0741) Pulse Rate: 55 (08/21 0741)  Labs: Recent Labs    08/29/18 0224  CREATININE 1.06*    Estimated Creatinine Clearance: 52.4 mL/min (A) (by C-G formula based on SCr of 1.06 mg/dL (H)).   Medical History: Past Medical History:  Diagnosis Date  . Esophageal reflux   . Hyperlipidemia   . Osteoporosis     Medications:  Medications Prior to Admission  Medication Sig Dispense Refill Last Dose  . aspirin EC 81 MG tablet Take 81 mg by mouth at bedtime.   08/21/2018  . Calcium Carb-Cholecalciferol (CALCIUM 600+D) 600-800 MG-UNIT TABS Take 1 tablet by mouth daily.   08/21/2018  . ferrous sulfate 325 (65 FE) MG tablet Take 325 mg by mouth daily with breakfast.   08/21/2018  . omeprazole (PRILOSEC) 40 MG capsule Take 40 mg by mouth daily.    08/22/2018 at Unknown time  . Potassium 99 MG TABS Take 99 mg by mouth daily.   08/21/2018  . simvastatin (ZOCOR) 20 MG tablet Take 20 mg by mouth daily at 6 PM.    08/22/2018 at Unknown time   Scheduled:  . amiodarone  200 mg Oral BID  . aspirin EC  81 mg Oral QHS  . bisacodyl  10 mg Oral Daily  . ferrous sulfate  325 mg Oral Q breakfast  . guaiFENesin  1,200 mg Oral BID  . pantoprazole  40 mg Oral Daily  . senna-docusate  1 tablet Oral QHS  . simvastatin  20 mg Oral q1800    Assessment: 68 yo female with new onset afib. Pharmacy consulted to dose apixaban. She is noted s/p VATS with lobectomy on 08/23/18. -hg= 10.2, plt= 116 (8/16)   Goal of Therapy:  Monitor platelets by anticoagulation protocol: Yes   Plan:  -apixaban 5mg  po bid  -Will provide patient education  Hildred Laser, PharmD Clinical  Pharmacist **Pharmacist phone directory can now be found on [email or web address] ([email or web address]).  Listed under [place].

## 2018-08-30 NOTE — Progress Notes (Signed)
Explained and discussed discharge instructions, follow up appoinment given, transitional pharmacy brought medicine. Pt ready to go home.

## 2018-08-30 NOTE — Plan of Care (Signed)
  Problem: Cardiac: Goal: Will achieve and/or maintain hemodynamic stability Outcome: Progressing   Problem: Clinical Measurements: Goal: Postoperative complications will be avoided or minimized Outcome: Progressing   Problem: Respiratory: Goal: Respiratory status will improve Outcome: Progressing   Problem: Skin Integrity: Goal: Wound healing without signs and symptoms infection will improve Outcome: Progressing   Problem: Health Behavior/Discharge Planning: Goal: Ability to manage health-related needs will improve Outcome: Progressing   Problem: Clinical Measurements: Goal: Ability to maintain clinical measurements within normal limits will improve Outcome: Progressing Goal: Will remain free from infection Outcome: Progressing Goal: Diagnostic test results will improve Outcome: Progressing Goal: Respiratory complications will improve Outcome: Progressing Goal: Cardiovascular complication will be avoided Outcome: Progressing   Problem: Skin Integrity: Goal: Risk for impaired skin integrity will decrease Outcome: Progressing

## 2018-09-03 ENCOUNTER — Encounter (HOSPITAL_COMMUNITY): Payer: Self-pay | Admitting: Hematology

## 2018-09-05 ENCOUNTER — Encounter (HOSPITAL_COMMUNITY): Payer: Self-pay | Admitting: *Deleted

## 2018-09-05 NOTE — Progress Notes (Signed)
I received notification following tumor board conference this morning that after discussion of her case, she may qualify for clinical trials.  I have reached out to Marzetta Board, Geophysicist/field seismologist at [employer] and asked that she review patient's history and let us know about clinical trials that are currently enrolling patients at this time.

## 2018-09-06 ENCOUNTER — Other Ambulatory Visit: Payer: Self-pay | Admitting: *Deleted

## 2018-09-06 NOTE — Progress Notes (Signed)
The proposed treatment discussed in cancer conference 09/05/2018 is for discussion purpose only and is not a binding recommendation.  The patient was not physically examined nor present for their treatment options.  Therefore, final treatment plans cannot be decided.   Dr. Tomie China nurse navigator updated on discussion.

## 2018-09-10 ENCOUNTER — Other Ambulatory Visit: Payer: Self-pay

## 2018-09-10 ENCOUNTER — Ambulatory Visit
Admission: RE | Admit: 2018-09-10 | Discharge: 2018-09-10 | Disposition: A | Discharge status: Home / Self Care | Payer: Medicare Other | Source: Ambulatory Visit | Attending: Thoracic Surgery (Cardiothoracic Vascular Surgery) | Admitting: Thoracic Surgery (Cardiothoracic Vascular Surgery)

## 2018-09-10 ENCOUNTER — Encounter (HOSPITAL_COMMUNITY): Payer: Self-pay | Admitting: *Deleted

## 2018-09-10 ENCOUNTER — Other Ambulatory Visit: Payer: Self-pay | Admitting: Thoracic Surgery (Cardiothoracic Vascular Surgery)

## 2018-09-10 ENCOUNTER — Encounter (HOSPITAL_COMMUNITY): Payer: Self-pay | Admitting: Hematology

## 2018-09-10 ENCOUNTER — Encounter: Payer: Self-pay | Admitting: Thoracic Surgery (Cardiothoracic Vascular Surgery)

## 2018-09-10 ENCOUNTER — Ambulatory Visit (INDEPENDENT_AMBULATORY_CARE_PROVIDER_SITE_OTHER): Payer: Self-pay | Admitting: Thoracic Surgery (Cardiothoracic Vascular Surgery)

## 2018-09-10 VITALS — BP 138/56 | HR 50 | Temp 97.7°F | Resp 16 | Ht 65.0 in | Wt 172.0 lb

## 2018-09-10 DIAGNOSIS — R911 Solitary pulmonary nodule: Secondary | ICD-10-CM

## 2018-09-10 DIAGNOSIS — Z902 Acquired absence of lung [part of]: Secondary | ICD-10-CM

## 2018-09-10 DIAGNOSIS — C3411 Malignant neoplasm of upper lobe, right bronchus or lung: Secondary | ICD-10-CM

## 2018-09-10 NOTE — Progress Notes (Signed)
[address]       [name] [address]             [phone]     HPI: Mrs. Cindy Butler returns for a scheduled follow-up visit  Cindy Butler is a 68 year old woman with a past medical history of tobacco abuse, hyperlipidemia, reflux, osteoporosis, and thrombocytopenia.  She had a low-dose screening CT due to her smoking history.  It showed a 1.2 cm spiculated right upper lobe nodule.  There were multiple other nodules present bilaterally.  On PET CT the right upper lobe nodule was hypermetabolic with an SUV of 2.3.  There was no evidence of metastatic disease.  She underwent a thoracoscopic right upper lobectomy on 08/23/2018.  Her postoperative course was complicated by atrial fibrillation.  She was discharged on amiodarone and apixaban.  She feels well.  She complains of some pain along the inner aspect of the right arm.  She has some numbness around her chest incision.  She is not having any shortness of breath.  She rarely needs to use the tramadol.  Past Medical History:  Diagnosis Date  . Esophageal reflux   . Hyperlipidemia   . Osteoporosis     Current Outpatient Medications  Medication Sig Dispense Refill  . amiodarone (PACERONE) 200 MG tablet Take 1 tablet (200 mg total) by mouth 2 (two) times daily. For one week;then take Amiodarone 200 mg daily thereafter 30 tablet 1  . apixaban (ELIQUIS) 5 MG TABS tablet Take 1 tablet (5 mg total) by mouth 2 (two) times daily. 60 tablet 1  . Calcium Carb-Cholecalciferol (CALCIUM 600+D) 600-800 MG-UNIT TABS Take 1 tablet by mouth daily.    . ferrous sulfate 325 (65 FE) MG tablet Take 325 mg by mouth daily with breakfast.    . omeprazole (PRILOSEC) 40 MG capsule Take 40 mg by mouth daily.     . Potassium 99 MG TABS Take 99 mg by mouth daily.    . simvastatin (ZOCOR) 20 MG tablet Take 20 mg by mouth daily at 6 PM.     . traMADol (ULTRAM) 50 MG tablet Take 1 tablet (50 mg total) by mouth every 6 (six) hours as needed  (mild pain). 28 tablet 0   No current facility-administered medications for this visit.     Physical Exam BP (!) 138/56 (BP Location: Left Arm, Patient Position: Sitting, Cuff Size: Normal)   Pulse (!) 50   Temp 97.7 F (36.5 C)   Resp 16   Ht 5\' 5"  (1.651 m)   Wt 171 lb 15.3 oz (78 kg)   SpO2 98% Comment: RA  BMI 28.2 kg/m  68 year old woman in no acute distress Alert and oriented x3 with no focal deficits Lungs diminished at right base, otherwise clear Cardiac bradycardic and regular rate  Incisions well-healed No peripheral edema  Diagnostic Tests: CHEST - 2 VIEW  COMPARISON:  Chest x-rays dated 08/30/2018, 08/26/2018 and 08/20/2018. Chest CT dated 07/25/2018  FINDINGS: There is increased tenting at the RIGHT lung base, presumably related to the given history of recent VATS procedure. Lungs are otherwise clear. No pulmonary nodule or mass is identified on today's chest x-ray. No pleural effusion. Previously described pneumothorax at the RIGHT lung apex has resolved. Osseous structures about the chest are unremarkable.  IMPRESSION: 1. Increased tenting at the RIGHT lung base, presumably due to volume loss related to the given history of recent VATS procedure. 2. Lungs are otherwise clear. No evidence of pneumonia or  pulmonary edema. No pleural effusion or pneumothorax is seen on today's exam.   Electronically Signed   By: Franki Cabot M.D.   On: 09/10/2018 12:37 I personally reviewed the chest x-ray images and concur with the findings noted above  Impression: Cindy Butler is a 68 year old woman with history of tobacco abuse who was found to have multiple lung nodules with a dominant nodule in the right upper lobe on low-dose screening CT.  I did a thoracoscopic right upper lobectomy on 08/23/2018.  The dominant nodule was an adenocarcinoma.  There was a second time adenocarcinoma in the same lobe so her pathologic stage was 2B (T3, N0).  She does have  other nodules in both lungs.  From a surgical standpoint she is doing well.  She has minimal pain.  Her exercise tolerance is good.  She may resume driving on a limited basis.  There are no restrictions on her other activities.  She has an appointment with Dr. Delton Coombes later this week.  She will discuss adjuvant therapy with him.  Plan: Follow-up with Dr. Delton Coombes as scheduled Return in 2 months with PA and lateral chest x-ray to check on progress Follow-up with Dr. Bronson Ing as scheduled  Melrose Nakayama, MD [facility] [phone]

## 2018-09-10 NOTE — Progress Notes (Signed)
I reached out to [facility]'s clinical research nurse.  Based on the patient's tissue type and molecular testing results,  there are currently no trials available that are accepting patients.  I will update Dr. Delton Coombes.

## 2018-09-11 ENCOUNTER — Other Ambulatory Visit (HOSPITAL_COMMUNITY): Payer: Self-pay | Admitting: *Deleted

## 2018-09-11 DIAGNOSIS — R911 Solitary pulmonary nodule: Secondary | ICD-10-CM

## 2018-09-11 DIAGNOSIS — E538 Deficiency of other specified B group vitamins: Secondary | ICD-10-CM

## 2018-09-11 DIAGNOSIS — D696 Thrombocytopenia, unspecified: Secondary | ICD-10-CM

## 2018-09-12 ENCOUNTER — Inpatient Hospital Stay (HOSPITAL_COMMUNITY): Payer: Medicare Other

## 2018-09-12 ENCOUNTER — Inpatient Hospital Stay (HOSPITAL_BASED_OUTPATIENT_CLINIC_OR_DEPARTMENT_OTHER): Payer: Medicare Other | Admitting: Hematology

## 2018-09-12 ENCOUNTER — Encounter (HOSPITAL_COMMUNITY): Payer: Self-pay | Admitting: Hematology

## 2018-09-12 ENCOUNTER — Other Ambulatory Visit: Payer: Self-pay

## 2018-09-12 ENCOUNTER — Inpatient Hospital Stay (HOSPITAL_COMMUNITY): Payer: Medicare Other | Attending: Hematology

## 2018-09-12 DIAGNOSIS — N289 Disorder of kidney and ureter, unspecified: Secondary | ICD-10-CM | POA: Insufficient documentation

## 2018-09-12 DIAGNOSIS — R0609 Other forms of dyspnea: Secondary | ICD-10-CM | POA: Diagnosis not present

## 2018-09-12 DIAGNOSIS — R911 Solitary pulmonary nodule: Secondary | ICD-10-CM

## 2018-09-12 DIAGNOSIS — C3411 Malignant neoplasm of upper lobe, right bronchus or lung: Secondary | ICD-10-CM | POA: Insufficient documentation

## 2018-09-12 DIAGNOSIS — E538 Deficiency of other specified B group vitamins: Secondary | ICD-10-CM

## 2018-09-12 DIAGNOSIS — Z87891 Personal history of nicotine dependence: Secondary | ICD-10-CM | POA: Diagnosis not present

## 2018-09-12 DIAGNOSIS — C3491 Malignant neoplasm of unspecified part of right bronchus or lung: Secondary | ICD-10-CM | POA: Insufficient documentation

## 2018-09-12 DIAGNOSIS — D696 Thrombocytopenia, unspecified: Secondary | ICD-10-CM

## 2018-09-12 DIAGNOSIS — E876 Hypokalemia: Secondary | ICD-10-CM | POA: Insufficient documentation

## 2018-09-12 LAB — CBC WITH DIFFERENTIAL/PLATELET
Abs Immature Granulocytes: 0.01 10*3/uL (ref 0.00–0.07)
Basophils Absolute: 0 10*3/uL (ref 0.0–0.1)
Basophils Relative: 1 %
Eosinophils Absolute: 0.1 10*3/uL (ref 0.0–0.5)
Eosinophils Relative: 2 %
HCT: 38.9 % (ref 36.0–46.0)
Hemoglobin: 12.4 g/dL (ref 12.0–15.0)
Immature Granulocytes: 0 %
Lymphocytes Relative: 43 %
Lymphs Abs: 1.6 10*3/uL (ref 0.7–4.0)
MCH: 32.1 pg (ref 26.0–34.0)
MCHC: 31.9 g/dL (ref 30.0–36.0)
MCV: 100.8 fL — ABNORMAL HIGH (ref 80.0–100.0)
Monocytes Absolute: 0.4 10*3/uL (ref 0.1–1.0)
Monocytes Relative: 11 %
Neutro Abs: 1.6 10*3/uL — ABNORMAL LOW (ref 1.7–7.7)
Neutrophils Relative %: 43 %
Platelets: 284 10*3/uL (ref 150–400)
RBC: 3.86 MIL/uL — ABNORMAL LOW (ref 3.87–5.11)
RDW: 13.1 % (ref 11.5–15.5)
WBC: 3.6 10*3/uL — ABNORMAL LOW (ref 4.0–10.5)
nRBC: 0 % (ref 0.0–0.2)

## 2018-09-12 LAB — COMPREHENSIVE METABOLIC PANEL
ALT: 22 U/L (ref 0–44)
AST: 26 U/L (ref 15–41)
Albumin: 4 g/dL (ref 3.5–5.0)
Alkaline Phosphatase: 83 U/L (ref 38–126)
Anion gap: 6 (ref 5–15)
BUN: 23 mg/dL (ref 8–23)
CO2: 26 mmol/L (ref 22–32)
Calcium: 9.7 mg/dL (ref 8.9–10.3)
Chloride: 107 mmol/L (ref 98–111)
Creatinine, Ser: 1.3 mg/dL — ABNORMAL HIGH (ref 0.44–1.00)
GFR calc Af Amer: 49 mL/min — ABNORMAL LOW (ref >60)
GFR calc non Af Amer: 42 mL/min — ABNORMAL LOW (ref >60)
Glucose, Bld: 104 mg/dL — ABNORMAL HIGH (ref 70–99)
Potassium: 4.9 mmol/L (ref 3.5–5.1)
Sodium: 139 mmol/L (ref 135–145)
Total Bilirubin: 0.4 mg/dL (ref 0.3–1.2)
Total Protein: 7.4 g/dL (ref 6.5–8.1)

## 2018-09-12 LAB — VITAMIN B12: Vitamin B-12: 689 pg/mL (ref 180–914)

## 2018-09-12 MED ORDER — CYANOCOBALAMIN 1000 MCG/ML IJ SOLN
1000.0000 ug | Freq: Once | INTRAMUSCULAR | Status: AC
  Administered 2018-09-12: 12:00:00 1000 ug via INTRAMUSCULAR

## 2018-09-12 MED ORDER — FOLIC ACID 1 MG PO TABS: 1.0000 mg | ORAL_TABLET | Freq: Every day | ORAL | 5 refills | Status: DC

## 2018-09-12 MED ORDER — CYANOCOBALAMIN 1000 MCG/ML IJ SOLN
INTRAMUSCULAR | Status: AC
  Filled 2018-09-12: qty 1

## 2018-09-12 NOTE — Progress Notes (Signed)
I met with patient during visit today with Dr. Delton Coombes. She is wanting to complete consolidation chemotherapy here.  I have provided her written education material for carboplatin and pemetrexed. I provided her with my contact information again and advised her to call should she have any questions or concerns.  She verbalizes understanding and all her questions were answered to her satisfaction today.

## 2018-09-12 NOTE — Patient Instructions (Signed)
[place] at [facility]  Discharge Instructions:  You received your B12 injection today.  Cyanocobalamin, Vitamin B12 injection What is this medicine? CYANOCOBALAMIN (sye an oh koe BAL a min) is a man made form of vitamin B12. Vitamin B12 is used in the growth of healthy blood cells, nerve cells, and proteins in the body. It also helps with the metabolism of fats and carbohydrates. This medicine is used to treat people who can not absorb vitamin B12. This medicine may be used for other purposes; ask your health care provider or pharmacist if you have questions. COMMON BRAND NAME(S): B-12 Compliance Kit, B-12 Injection Kit, Cyomin, LA-12, Nutri-Twelve, Physicians EZ Use B-12, Primabalt What should I tell my health care provider before I take this medicine? They need to know if you have any of these conditions:  kidney disease  Leber's disease  megaloblastic anemia  an unusual or allergic reaction to cyanocobalamin, cobalt, other medicines, foods, dyes, or preservatives  pregnant or trying to get pregnant  breast-feeding How should I use this medicine? This medicine is injected into a muscle or deeply under the skin. It is usually given by a health care professional in a clinic or doctor's office. However, your doctor may teach you how to inject yourself. Follow all instructions. Talk to your pediatrician regarding the use of this medicine in children. Special care may be needed. Overdosage: If you think you have taken too much of this medicine contact a poison control center or emergency room at once. NOTE: This medicine is only for you. Do not share this medicine with others. What if I miss a dose? If you are given your dose at a clinic or doctor's office, call to reschedule your appointment. If you give your own injections and you miss a dose, take it as soon as you can. If it is almost time for your next dose, take only that dose. Do not take double or extra  doses. What may interact with this medicine?  colchicine  heavy alcohol intake This list may not describe all possible interactions. Give your health care provider a list of all the medicines, herbs, non-prescription drugs, or dietary supplements you use. Also tell them if you smoke, drink alcohol, or use illegal drugs. Some items may interact with your medicine. What should I watch for while using this medicine? Visit your doctor or health care professional regularly. You may need blood work done while you are taking this medicine. You may need to follow a special diet. Talk to your doctor. Limit your alcohol intake and avoid smoking to get the best benefit. What side effects may I notice from receiving this medicine? Side effects that you should report to your doctor or health care professional as soon as possible:  allergic reactions like skin rash, itching or hives, swelling of the face, lips, or tongue  blue tint to skin  chest tightness, pain  difficulty breathing, wheezing  dizziness  red, swollen painful area on the leg Side effects that usually do not require medical attention (report to your doctor or health care professional if they continue or are bothersome):  diarrhea  headache This list may not describe all possible side effects. Call your doctor for medical advice about side effects. You may report side effects to FDA at 1-800-FDA-1088. Where should I keep my medicine? Keep out of the reach of children. Store at room temperature between 15 and 30 degrees C (59 and 85 degrees F). Protect from light.  Throw away any unused medicine after the expiration date. NOTE: This sheet is a summary. It may not cover all possible information. If you have questions about this medicine, talk to your doctor, pharmacist, or health care provider.  2020 Elsevier/Gold Standard (2007-04-08 22:10:20)  _______________________________________________________________  Thank you for  choosing [place] at [facility] to provide your oncology and hematology care.  To afford each patient quality time with our providers, please arrive at least 15 minutes before your scheduled appointment.  You need to re-schedule your appointment if you arrive 10 or more minutes late.  We strive to give you quality time with our providers, and arriving late affects you and other patients whose appointments are after yours.  Also, if you no show three or more times for appointments you may be dismissed from the clinic.  Again, thank you for choosing [place] at [facility] hope is that these requests will allow you access to exceptional care and in a timely manner. _______________________________________________________________  If you have questions after your visit, please contact our office at [phone] between the hours of 8:30 a.m. and 5:00 p.m. Voicemails left after 4:30 p.m. will not be returned until the following business day. _______________________________________________________________  For prescription refill requests, have your pharmacy contact our office. _______________________________________________________________  Recommendations made by the consultant and any test results will be sent to your referring physician. _______________________________________________________________

## 2018-09-12 NOTE — Progress Notes (Signed)
Cindy Butler presents today for B12 injection. VSS. Injection tolerated without incident or complaint. See MAR for details. Patient discharged in satisfactory condition with follow up instructions.

## 2018-09-12 NOTE — Assessment & Plan Note (Signed)
1.  T3N0 right lung adenocarcinoma: -PET scan on 07/29/2018 showed 1.5 x 0.7 cm right upper lobe nodule, SUV 2.3.  Part solid 0.6 cm right upper lobe pulmonary nodule, maximum SUV 1.2. -Status post resection on 08/23/2018 with pathology showing adenocarcinoma, 2 tumors, 1.3 cm and 0.7 cm, margins negative, 0/15 lymph nodes positive. -PDL 1 TPS 1%.  Foundation 1 showed TMB low, MS-stable.  No other targetable mutations. - We talked about adjuvant chemotherapy based on NCCN recommendations. -She has mild renal insufficiency.  She is not a candidate for cisplatin.  I have recommended carboplatin and pemetrexed once every 3 weeks for 4 cycles. -We talked about side effects in detail.  She will be given B12 injection today.  We have sent a prescription for folic acid 1 mg daily. -We will plan to start in 2 weeks.

## 2018-09-12 NOTE — Patient Instructions (Addendum)
[place] at [facility] Discharge Instructions  You were seen today by Dr. Delton Coombes. He went over your recent test results. He also discussed your plan of care with you as well as how many treatments you will need. The medications are Carboplatin and Pemetrexed you will need these once every 3 weeks. He will see you back in 2 weeks for labs and follow up.   Thank you for choosing [place] at [facility] to provide your oncology and hematology care.  To afford each patient quality time with our provider, please arrive at least 15 minutes before your scheduled appointment time.   If you have a lab appointment with the [facility] please come in thru the  Main Entrance and check in at the main information desk  You need to re-schedule your appointment should you arrive 10 or more minutes late.  We strive to give you quality time with our providers, and arriving late affects you and other patients whose appointments are after yours.  Also, if you no show three or more times for appointments you may be dismissed from the clinic at the providers discretion.     Again, thank you for choosing [facility].  Our hope is that these requests will decrease the amount of time that you wait before being seen by our physicians.       _____________________________________________________________  Should you have questions after your visit to [facility], please contact our office at [phone] between the hours of 8:00 a.m. and 4:30 p.m.  Voicemails left after 4:00 p.m. will not be returned until the following business day.  For prescription refill requests, have your pharmacy contact our office and allow 72 hours.    Cancer Center Support Programs:   > Cancer Support Group  2nd Tuesday of the month 1pm-2pm, Journey Room

## 2018-09-12 NOTE — Progress Notes (Signed)
Cindy Butler, [place] [address]   CLINIC:  Medical Oncology/Hematology  PCP:  Scherrie Bateman [place] [address] [phone]   REASON FOR VISIT:  Follow-up for right lung nodule and thrombocytopenia.   INTERVAL HISTORY:  Cindy Butler 68 y.o. female seen for follow-up of lung cancer.  She had resection done on 08/23/2018.  She is recovering well.  She has mild pain at the incision site.  Appetite is 100%.  Energy levels are 50%.  Shortness of breath on exertion is reported.  She had 1 episode of vomiting last night and she thinks that she had eaten too much.  Sleep problems present.  Denies any fevers or chills.    REVIEW OF SYSTEMS:  Review of Systems  Respiratory: Positive for shortness of breath.   Cardiovascular: Positive for chest pain.  Gastrointestinal: Positive for vomiting.  Psychiatric/Behavioral: Positive for sleep disturbance.  All other systems reviewed and are negative.    PAST MEDICAL/SURGICAL HISTORY:  Past Medical History:  Diagnosis Date  . Esophageal reflux   . Hyperlipidemia   . Osteoporosis    Past Surgical History:  Procedure Laterality Date  . COLONOSCOPY  09/27/2010   Procedure: COLONOSCOPY;  Surgeon: Jamesetta So;  Location: [facility];  Service: Gastroenterology;  Laterality: N/A;  . NASAL SINUS SURGERY    . TUBAL LIGATION    . VIDEO ASSISTED THORACOSCOPY (VATS)/ LOBECTOMY Right 08/23/2018   Procedure: Right Video Assisted Thoracoscopy (Vats) with Right Upper Lobectomy and Node Dissection;  Surgeon: Melrose Nakayama, MD;  Location: [facility] OR;  Service: Thoracic;  Laterality: Right;     SOCIAL HISTORY:  Social History   Socioeconomic History  . Marital status: Married    Spouse name: Aaron Edelman  . Number of children: 3  . Years of education: Not on file  . Highest education level: Not on file  Occupational History  . Occupation: retired  Scientific laboratory technician  . Financial  resource strain: Not hard at all  . Food insecurity    Worry: Never true    Inability: Never true  . Transportation needs    Medical: No    Non-medical: No  Tobacco Use  . Smoking status: Former Smoker    Packs/day: 0.50    Years: 55.00    Pack years: 27.50    Types: Cigarettes    Quit date: 08/16/2018    Years since quitting: 0.0  . Smokeless tobacco: Never Used  Substance and Sexual Activity  . Alcohol use: Yes    Alcohol/week: 3.0 standard drinks    Types: 3 Standard drinks or equivalent per week  . Drug use: Never  . Sexual activity: Not on file  Lifestyle  . Physical activity    Days per week: 0 days    Minutes per session: 0 min  . Stress: Not at all  Relationships  . Social Herbalist on phone: Twice a week    Gets together: Once a week    Attends religious service: 1 to 4 times per year    Active member of club or organization: No    Attends meetings of clubs or organizations: Never    Relationship status: Married  . Intimate partner violence    Fear of current or ex partner: No    Emotionally abused: No    Physically abused: No    Forced sexual activity: No  Other Topics Concern  . Not on file  Social History Narrative  . Not on file    FAMILY HISTORY:  Family History  Problem Relation Age of Onset  . Bone cancer Mother   . Alcohol abuse Father   . Melanoma Brother   . Breast cancer Maternal Grandmother     CURRENT MEDICATIONS:  Outpatient Encounter Medications as of 09/12/2018  Medication Sig  . amiodarone (PACERONE) 200 MG tablet Take 1 tablet (200 mg total) by mouth 2 (two) times daily. For one week;then take Amiodarone 200 mg daily thereafter  . apixaban (ELIQUIS) 5 MG TABS tablet Take 1 tablet (5 mg total) by mouth 2 (two) times daily.  . Calcium Carb-Cholecalciferol (CALCIUM 600+D) 600-800 MG-UNIT TABS Take 1 tablet by mouth daily.  . ferrous sulfate 325 (65 FE) MG tablet Take 325 mg by mouth daily with breakfast.  . omeprazole  (PRILOSEC) 40 MG capsule Take 40 mg by mouth daily.   . Potassium 99 MG TABS Take 99 mg by mouth daily.  . simvastatin (ZOCOR) 20 MG tablet Take 20 mg by mouth daily at 6 PM.   . folic acid (FOLVITE) 1 MG tablet Take 1 tablet (1 mg total) by mouth daily.  . traMADol (ULTRAM) 50 MG tablet Take 1 tablet (50 mg total) by mouth every 6 (six) hours as needed (mild pain). (Patient not taking: Reported on 09/12/2018)  . [EXPIRED] cyanocobalamin ((VITAMIN B-12)) injection 1,000 mcg    No facility-administered encounter medications on file as of 09/12/2018.     ALLERGIES:  No Known Allergies   PHYSICAL EXAM:  ECOG Performance status: 1  Vitals:   09/12/18 1056  BP: (!) 143/103  Pulse: (!) 54  Resp: 20  Temp: 97.8 F (36.6 C)  SpO2: 100%   Filed Weights   09/12/18 1056  Weight: 172 lb 12.8 oz (78.4 kg)    Physical Exam Vitals signs reviewed.  Constitutional:      Appearance: Normal appearance.  Cardiovascular:     Rate and Rhythm: Normal rate and regular rhythm.     Heart sounds: Normal heart sounds.  Pulmonary:     Effort: Pulmonary effort is normal.     Breath sounds: Normal breath sounds.  Abdominal:     General: There is no distension.     Palpations: Abdomen is soft. There is no mass.  Musculoskeletal:        General: No swelling.  Lymphadenopathy:     Cervical: No cervical adenopathy.  Skin:    General: Skin is warm.  Neurological:     General: No focal deficit present.     Mental Status: She is alert and oriented to person, place, and time.  Psychiatric:        Mood and Affect: Mood normal.        Behavior: Behavior normal.      LABORATORY DATA:  I have reviewed the labs as listed.  CBC    Component Value Date/Time   WBC 3.6 (L) 09/12/2018 0952   RBC 3.86 (L) 09/12/2018 0952   HGB 12.4 09/12/2018 0952   HCT 38.9 09/12/2018 0952   PLT 284 09/12/2018 0952   MCV 100.8 (H) 09/12/2018 0952   MCH 32.1 09/12/2018 0952   MCHC 31.9 09/12/2018 0952   RDW 13.1  09/12/2018 0952   LYMPHSABS 1.6 09/12/2018 0952   MONOABS 0.4 09/12/2018 0952   EOSABS 0.1 09/12/2018 0952   BASOSABS 0.0 09/12/2018 0952   CMP Latest Ref Rng & Units 09/12/2018 08/29/2018 08/25/2018  Glucose 70 - 99 mg/dL  104(H) 105(H) 121(H)  BUN 8 - 23 mg/dL '23 9 13  '$ Creatinine 0.44 - 1.00 mg/dL 1.30(H) 1.06(H) 1.02(H)  Sodium 135 - 145 mmol/L 139 139 138  Potassium 3.5 - 5.1 mmol/L 4.9 3.7 4.3  Chloride 98 - 111 mmol/L 107 105 109  CO2 22 - 32 mmol/L 26 25 21(L)  Calcium 8.9 - 10.3 mg/dL 9.7 8.5(L) 8.5(L)  Total Protein 6.5 - 8.1 g/dL 7.4 - 5.5(L)  Total Bilirubin 0.3 - 1.2 mg/dL 0.4 - 0.7  Alkaline Phos 38 - 126 U/L 83 - 60  AST 15 - 41 U/L 26 - 35  ALT 0 - 44 U/L 22 - 26       DIAGNOSTIC IMAGING:  I have independently reviewed the scans and discussed with the patient.   I have reviewed Venita Lick LPN's note and agree with the documentation.  I personally performed a face-to-face visit, made revisions and my assessment and plan is as follows.    ASSESSMENT & PLAN:   Adenocarcinoma of right lung ([place]) 1.  T3N0 right lung adenocarcinoma: -PET scan on 07/29/2018 showed 1.5 x 0.7 cm right upper lobe nodule, SUV 2.3.  Part solid 0.6 cm right upper lobe pulmonary nodule, maximum SUV 1.2. -Status post resection on 08/23/2018 with pathology showing adenocarcinoma, 2 tumors, 1.3 cm and 0.7 cm, margins negative, 0/15 lymph nodes positive. -PDL 1 TPS 1%.  Foundation 1 showed TMB low, MS-stable.  No other targetable mutations. - We talked about adjuvant chemotherapy based on NCCN recommendations. -She has mild renal insufficiency.  She is not a candidate for cisplatin.  I have recommended carboplatin and pemetrexed once every 3 weeks for 4 cycles. -We talked about side effects in detail.  She will be given B12 injection today.  We have sent a prescription for folic acid 1 mg daily. -We will plan to start in 2 weeks.  Total time spent is 40 minutes with more than 50% of the time  spent face-to-face discussing diagnosis, pathology report, treatment plan, counseling and coordination of care.    Orders placed this encounter:  No orders of the defined types were placed in this encounter.     Derek Jack, MD [place] [phone]

## 2018-09-12 NOTE — Progress Notes (Signed)
START ON PATHWAY REGIMEN - Non-Small Cell Lung     A cycle is every 21 days:     Pemetrexed      Carboplatin   **Always confirm dose/schedule in your pharmacy ordering system**  Patient Characteristics: Stage IIB - III - Resected (Adjuvant), No Prior Chemotherapy, Nonsquamous Cell AJCC T Category: T3 Current Disease Status: No Distant Mets or Local Recurrence AJCC N Category: N0 AJCC M Category: M0 AJCC 8 Stage Grouping: IIB Histology: Nonsquamous Cell Intent of Therapy: Curative Intent, Discussed with Patient

## 2018-09-12 NOTE — Progress Notes (Signed)
Patient tolerated injection with no complaints voiced.  Site clean and dry with no bruising or swelling noted at site.  Band aid applied.  Vss with discharge and left ambulatory with no s/s of distress noted.  

## 2018-09-17 ENCOUNTER — Inpatient Hospital Stay (HOSPITAL_COMMUNITY): Payer: Medicare Other | Admitting: General Practice

## 2018-09-17 ENCOUNTER — Inpatient Hospital Stay (HOSPITAL_COMMUNITY): Payer: Medicare Other

## 2018-09-17 ENCOUNTER — Other Ambulatory Visit: Payer: Self-pay

## 2018-09-17 ENCOUNTER — Encounter (HOSPITAL_COMMUNITY): Payer: Self-pay | Admitting: General Practice

## 2018-09-17 DIAGNOSIS — C3491 Malignant neoplasm of unspecified part of right bronchus or lung: Secondary | ICD-10-CM

## 2018-09-17 MED ORDER — CYANOCOBALAMIN 1000 MCG/ML IJ SOLN: 1000.0000 ug | Freq: Once | INTRAMUSCULAR | Status: DC

## 2018-09-17 MED ORDER — PROCHLORPERAZINE MALEATE 10 MG PO TABS: 10.0000 mg | ORAL_TABLET | Freq: Four times a day (QID) | ORAL | 1 refills | Status: DC | PRN

## 2018-09-17 NOTE — Patient Instructions (Addendum)
[facility] Chemotherapy Teaching    You have been diagnosed with Stage II non-small cell lung cancer.  You will be treated every 3 weeks with carboplatin and pemetrexed.  The intent of treatment is cure.  You will see the doctor regularly throughout treatment.  We monitor your lab work prior to every treatment. The doctor monitors your response to treatment by the way you are feeling, your blood work, and scans periodically.  There will be wait times while you are here for treatment.  It will take about 30 minutes to 1 hour for your lab work to result.  Then there will be wait times while pharmacy mixes your medications.   You must take folic acid every day by mouth beginning 7 days before your first dose of alimta.  You must keep taking folic acid every day during the time you are being treated with alimta, and for every day for 21 days after you receive your last alimta dose.    You will get B12 injections while you are on alimta.  The first one about 1 week prior to starting treatment and then about every 9 weeks during treatment.    Medications you will receive in the clinic prior to your chemotherapy medications:  Aloxi:  ALOXI is used in adults to help prevent nausea and vomiting that happens with certain chemotherapy drugs.  Aloxi is a long acting medication, and will remain in your system for about two days.   Emend:  This is an anti-nausea medication that is used with Aloxi to help prevent nausea and vomiting caused by chemotherapy.  Dexamethasone:  This is a steroid given prior to chemotherapy to help prevent allergic reactions; it may also help prevent and control nausea and diarrhea.    Carboplatin (Generic Name) Other Names: Paraplatin, CBDCA  About This Drug Carboplatin is a drug used to treat cancer. This drug is given in the vein (IV).  Takes 30 minutes to infuse.   Possible Side Effects (More Common) . Nausea and throwing up (vomiting). These symptoms may  happen within a few hours after your treatment and may last up to 24 hours. Medicines are available to stop or lessen these side effects. . Bone marrow depression. This is a decrease in the number of white blood cells, red blood cells, and platelets. This may raise your risk of infection, make you tired and weak (fatigue), and raise your risk of bleeding. . Soreness of the mouth and throat. You may have red areas, white patches, or sores that hurt. . This drug may affect how your kidneys work. Your kidney function will be checked as needed. . Electrolyte changes. Your blood will be checked for electrolyte changes as needed.  Possible Side Effects (Less Common) . Hair loss. Some patients lose their hair on the scalp and body. You may notice your hair thinning seven to 14 days after getting this drug. . Effects on the nerves are called peripheral neuropathy. You may feel numbness, tingling, or pain in your hands and feet. It may be hard for you to button your clothes, open jars, or walk as usual. The effect on the nerves may get worse with more doses of the drug. These effects get better in some people after the drug is stopped but it does not get better in all people. . Loose bowel movements (diarrhea) that may last for several days . Decreased hearing or ringing in the ears . Changes in the way food and drinks taste .  Changes in liver function. Your liver function will be checked as needed  Allergic Reactions Serious allergic reactions including anaphylaxis are rare. While you are getting this drug in your vein (IV), tell your nurse right away if you have any of these symptoms of an allergic reaction: . Trouble catching your breath . Feeling like your tongue or throat are swelling . Feeling your heart beat quickly or in a not normal way (palpitations) . Feeling dizzy or lightheaded . Flushing, itching, rash, and/or hives  Treating Side Effects . Drink 6-8 cups of fluids each day unless your  doctor has told you to limit your fluid intake due to some other health problem. A cup is 8 ounces of fluid. If you throw up or have loose bowel movements, you should drink more fluids so that you do not become dehydrated (lack water in the body from losing too much fluid). . Mouth care is very important. Your mouth care should consist of routine, gentle cleaning of your teeth or dentures and rinsing your mouth with a mixture of 1/2 teaspoon of salt in 8 ounces of water or  teaspoon of baking soda in 8 ounces of water. This should be done at least after each meal and at bedtime. . If you have mouth sores, avoid mouthwash that has alcohol. Avoid alcohol and smoking because they can bother your mouth and throat. . If you have numbness and tingling in your hands and feet, be careful when cooking, walking, and handling sharp objects and hot liquids. . Talk with your nurse about getting a wig before you lose your hair. Also, call the [place] at [phone]-2345 to find out information about the "Look Good, Feel Better" program close to where you live. It is a free program where women getting chemotherapy can learn about wigs, turbans and scarves as well as makeup techniques and skin and nail care.  Food and Drug Interactions There are no known interactions of carboplatin with food. This drug may interact with other medicines. Tell your doctor and pharmacist about all the medicines and dietary supplements (vitamins, minerals, herbs and others) that you are taking at this time. The safety and use of dietary supplements and alternative diets are often not known. Using these might affect your cancer or interfere with your treatment. Until more is known, you should not use dietary supplements or alternative diets without your cancer doctor's help.  When to Call the Doctor Call your doctor or nurse right away if you have any of these symptoms: . Fever of 100.5 F (38 C) or above; chills . Bleeding or  bruising that is not normal . Wheezing or trouble breathing . Nausea that stops you from eating or drinking . Throwing up more than once a day . Rash or itching . Loose bowel movements (diarrhea) more than four times a day or diarrhea with weakness or feeling lightheaded . Call your doctor or nurse as soon as possible if any of these symptoms happen: . Numbness, tingling, decreased feeling or weakness in fingers, toes, arms, or legs . Change in hearing, ringing in the ears . Blurred vision or other changes in eyesight . Decreased urine . Yellowing of skin or eyes  Sexual Problems and Reproductive Concerns Sexual problems and reproduction concerns may happen. In both men and women, this drug may affect your ability to have children. This cannot be determined before your treatment. Talk with your doctor or nurse if you plan to have children. Ask for information on  sperm or egg banking. In men, this drug may interfere with your ability to make sperm, but it should not change your ability to have sexual relations. In women, menstrual bleeding may become irregular or stop while you are getting this drug. Do not assume that you cannot become pregnant if you do not have a menstrual period. Women may go through signs of menopause (change of life) like vaginal dryness or itching. Vaginal lubricants can be used to lessen vaginal dryness, itching, and pain during sexual relations. Genetic counseling is available for you to talk about the effects of this drug therapy on future pregnancies. Also, a genetic counselor can look at the possible risk of problems in the unborn baby due to this medicine if an exposure happens during pregnancy. . Pregnancy warning: This drug may have harmful effects on the unborn child, so effective methods of birth control should be used during your cancer treatment. . Breast feeding warning: It is not known if this drug passes into breast milk. For this reason, women should talk to  their doctor about the risks and benefits of breast feeding during treatment with this drug because this drug may enter the breast milk and badly harm a breast feeding baby.   Pemetrexed (Generic Name) Other Names: ALIMTA  About This Drug Pemetrexed is used to treat cancer. It is given in the vein (IV).  Takes 10 minutes to infuse.   Possible Side Effects (More Common) . Bone marrow depression. This is a decrease in the number of white blood cells, red blood cells, and platelets. This may raise your risk of infection, make you tired and weak (fatigue), and raise your risk of bleeding. . Fatigue . Soreness of the mouth and throat. You may have red areas, white patches, or sores that hurt.  Possible Side Effects (less common) . Trouble breathing or feeling short of breath . Nausea and throwing up (vomiting) . Skin rash  Treating Side Effects . Ask your doctor or nurse about medicine that is available to help stop or lessen nausea and throwing up. . If you get a rash, do not put anything on it unless your doctor or nurse says you may. Keep the area around the rash clean and dry. . Mouth care is very important. Your mouth care should consist of routine, gentle cleaning of your teeth or dentures and rinsing your mouth with a mixture of 1/2 teaspoon of salt in 8 ounces of water or  teaspoon of baking soda in 8 ounces of water. This should be done at least after each meal and at bedtime. . If you have mouth sores, avoid mouthwash that has alcohol. Avoid alcohol and smoking because they can bother your mouth and throat.  Food and Drug Interactions There are no known interactions of this medicine with food. Tell your doctor if you are taking ibuprofen. Pemetrexed may interact with other medicines. Tell your doctor and pharmacist about all the medicines and dietary supplements (vitamins, minerals, herbs and others) that you are taking at this time. The safety and use of dietary supplements and  alternative diets are often not known. Using these might affect your cancer or interfere with your treatment. Until more is known, you should not use dietary supplements or alternative diets without your cancer doctor's help.  When to Call the Doctor Call your doctor or nurse right away if you have any of these symptoms: . Temperature of 100.5 F (38 C) or above . Chills . Easy bruising or bleeding .  Trouble breathing . Chest pain . Nausea that stops you from eating or drinking . Throwing up more than 3 times in a day Call your doctor or nurse as soon as possible if you have any of these symptoms: . Rash that does not go away with prescribed medicine . Nausea or vomiting that does not go away with prescribed medicine . Extreme fatigue that interferes with normal activities  Reproduction Concerns . Pregnancy warning: This drug may have harmful effects on the unborn child, so effective methods of birth control should be used during your cancer treatment. . Genetic counseling is available for you to talk about the effects of this drug therapy on future pregnancies. Also, a genetic counselor can look at the possible risk of problems in the unborn baby due to this medicine if an exposure happens during pregnancy. . Breast feeding warning: It is not known if this drug passes into breast milk. For this reason, women should talk to their doctor about the risks and benefits of breast feeding during treatment with this drug because this drug may enter the breast milk and badly harm a breast feeding baby.  SELF CARE ACTIVITIES WHILE RECEIVING CHEMOTHERAPY:  Hydration Increase your fluid intake 48 hours prior to treatment and drink at least 8 to 12 cups (64 ounces) of water/decaffeinated beverages per day after treatment. You can still have your cup of coffee or soda but these beverages do not count as part of your 8 to 12 cups that you need to drink daily. No alcohol intake.  Medications Continue  taking your normal prescription medication as prescribed.  If you start any new herbal or new supplements please let us know first to make sure it is safe.  Mouth Care Have teeth cleaned professionally before starting treatment. Keep dentures and partial plates clean. Use soft toothbrush and do not use mouthwashes that contain alcohol. Biotene is a good mouthwash that is available at most pharmacies or may be ordered by calling [phone]. Use warm salt water gargles (1 teaspoon salt per 1 quart warm water) before and after meals and at bedtime. If you need dental work, please let the doctor know before you go for your appointment so that we can coordinate the best possible time for you in regards to your chemo regimen. You need to also let your dentist know that you are actively taking chemo. We may need to do labs prior to your dental appointment.  Skin Care Always use sunscreen that has not expired and with SPF (Sun Protection Factor) of 50 or higher. Wear hats to protect your head from the sun. Remember to use sunscreen on your hands, ears, face, & feet.  Use good moisturizing lotions such as udder cream, eucerin, or even Vaseline. Some chemotherapies can cause dry skin, color changes in your skin and nails.    . Avoid long, hot showers or baths. . Use gentle, fragrance-free soaps and laundry detergent. . Use moisturizers, preferably creams or ointments rather than lotions because the thicker consistency is better at preventing skin dehydration. Apply the cream or ointment within 15 minutes of showering. Reapply moisturizer at night, and moisturize your hands every time after you wash them.  Hair Loss (if your doctor says your hair will fall out)  . If your doctor says that your hair is likely to fall out, decide before you begin chemo whether you want to wear a wig. You may want to shop before treatment to match your hair color. . Hats,  turbans, and scarves can also camouflage hair loss,  although some people prefer to leave their heads uncovered. If you go bare-headed outdoors, be sure to use sunscreen on your scalp. . Cut your hair short. It eases the inconvenience of shedding lots of hair, but it also can reduce the emotional impact of watching your hair fall out. . Don't perm or color your hair during chemotherapy. Those chemical treatments are already damaging to hair and can enhance hair loss. Once your chemo treatments are done and your hair has grown back, it's OK to resume dyeing or perming hair.  With chemotherapy, hair loss is almost always temporary. But when it grows back, it may be a different color or texture. In older adults who still had hair color before chemotherapy, the new growth may be completely gray.  Often, new hair is very fine and soft.  Infection Prevention Please wash your hands for at least 30 seconds using warm soapy water. Handwashing is the #1 way to prevent the spread of germs. Stay away from sick people or people who are getting over a cold. If you develop respiratory systems such as green/yellow mucus production or productive cough or persistent cough let us know and we will see if you need an antibiotic. It is a good idea to keep a pair of gloves on when going into grocery stores/[employer] to decrease your risk of coming into contact with germs on the carts, etc. Carry alcohol hand gel with you at all times and use it frequently if out in public. If your temperature reaches 100.5 or higher please call the clinic and let us know.  If it is after hours or on the weekend please go to the ER if your temperature is over 100.5.  Please have your own personal thermometer at home to use.    Sex and bodily fluids If you are going to have sex, a condom must be used to protect the person that isn't taking chemotherapy. Chemo can decrease your libido (sex drive). For a few days after chemotherapy, chemotherapy can be excreted through your bodily fluids.  When using  the toilet please close the lid and flush the toilet twice.  Do this for a few day after you have had chemotherapy.   Effects of chemotherapy on your sex life Some changes are simple and won't last long. They won't affect your sex life permanently.  Sometimes you may feel: . too tired . not strong enough to be very active . sick or sore  . not in the mood . anxious or low Your anxiety might not seem related to sex. For example, you may be worried about the cancer and how your treatment is going. Or you may be worried about money, or about how you family are coping with your illness. These things can cause stress, which can affect your interest in sex. It's important to talk to your partner about how you feel. Remember - the changes to your sex life don't usually last long. There's usually no medical reason to stop having sex during chemo. The drugs won't have any long term physical effects on your performance or enjoyment of sex. Cancer can't be passed on to your partner during sex  Contraception It's important to use reliable contraception during treatment. Avoid getting pregnant while you or your partner are having chemotherapy. This is because the drugs may harm the baby. Sometimes chemotherapy drugs can leave a man or woman infertile.  This means you would not be  able to have children in the future. You might want to talk to someone about permanent infertility. It can be very difficult to learn that you may no longer be able to have children. Some people find counselling helpful. There might be ways to preserve your fertility, although this is easier for men than for women. You may want to speak to a fertility expert. You can talk about sperm banking or harvesting your eggs. You can also ask about other fertility options, such as donor eggs. If you have or have had breast cancer, your doctor might advise you not to take the contraceptive pill. This is because the hormones in it might affect the  cancer.  It is not known for sure whether or not chemotherapy drugs can be passed on through semen or secretions from the vagina. Because of this some doctors advise people to use a barrier method if you have sex during treatment. This applies to vaginal, anal or oral sex. Generally, doctors advise a barrier method only for the time you are actually having the treatment and for about a week after your treatment. Advice like this can be worrying, but this does not mean that you have to avoid being intimate with your partner. You can still have close contact with your partner and continue to enjoy sex.  Animals If you have cats or birds we just ask that you not change the litter or change the cage.  Please have someone else do this for you while you are on chemotherapy.   Food Safety During and After Cancer Treatment Food safety is important for people both during and after cancer treatment. Cancer and cancer treatments, such as chemotherapy, radiation therapy, and stem cell/bone marrow transplantation, often weaken the immune system. This makes it harder for your body to protect itself from foodborne illness, also called food poisoning. Foodborne illness is caused by eating food that contains harmful bacteria, parasites, or viruses.  Foods to avoid Some foods have a higher risk of becoming tainted with bacteria. These include: Marland Kitchen Unwashed fresh fruit and vegetables, especially leafy vegetables that can hide dirt and other contaminants . Raw sprouts, such as alfalfa sprouts . Raw or undercooked beef, especially ground beef, or other raw or undercooked meat and poultry . Fatty, fried, or spicy foods immediately before or after treatment.  These can sit heavy on your stomach and make you feel nauseous. . Raw or undercooked shellfish, such as oysters. . Sushi and sashimi, which often contain raw fish.  . Unpasteurized beverages, such as unpasteurized fruit juices, raw milk, raw yogurt, or  cider . Undercooked eggs, such as soft boiled, over easy, and poached; raw, unpasteurized eggs; or foods made with raw egg, such as homemade raw cookie dough and homemade mayonnaise  Simple steps for food safety  Shop smart. . Do not buy food stored or displayed in an unclean area. . Do not buy bruised or damaged fruits or vegetables. . Do not buy cans that have cracks, dents, or bulges. . Pick up foods that can spoil at the end of your shopping trip and store them in a cooler on the way home.  Prepare and clean up foods carefully. . Rinse all fresh fruits and vegetables under running water, and dry them with a clean towel or paper towel. . Clean the top of cans before opening them. . After preparing food, wash your hands for 20 seconds with hot water and soap. Pay special attention to areas between fingers and under  nails. . Clean your utensils and dishes with hot water and soap. Marland Kitchen Disinfect your kitchen and cutting boards using 1 teaspoon of liquid, unscented bleach mixed into 1 quart of water.    Dispose of old food. . Eat canned and packaged food before its expiration date (the "use by" or "best before" date). . Consume refrigerated leftovers within 3 to 4 days. After that time, throw out the food. Even if the food does not smell or look spoiled, it still may be unsafe. Some bacteria, such as Listeria, can grow even on foods stored in the refrigerator if they are kept for too long.  Take precautions when eating out. . At restaurants, avoid buffets and salad bars where food sits out for a long time and comes in contact with many people. Food can become contaminated when someone with a virus, often a norovirus, or another "bug" handles it. . Put any leftover food in a "to-go" container yourself, rather than having the server do it. And, refrigerate leftovers as soon as you get home. . Choose restaurants that are clean and that are willing to prepare your food as you order it  cooked.   MEDICATIONS:                                                                                                                                                                    Compazine/Prochlorperazine 10mg  tablet. Take 1 tablet every 6 hours as needed for nausea/vomiting. (This can make you sleepy)        EMLA cream. Apply a quarter size amount to port site 1 hour prior to chemo. Do not rub in. Cover with plastic wrap.    Diarrhea Sheet   If you are having loose stools/diarrhea, please purchase Imodium and begin taking as outlined:  At the first sign of poorly formed or loose stools you should begin taking Imodium (loperamide) 2 mg capsules.  Take two tablets (4mg ) followed by one tablet (2mg ) every 2 hours - DO NOT EXCEED 8 tablets in 24 hours.  If it is bedtime and you are having loose stools, take 2 tablets at bedtime, then 2 tablets every 4 hours until morning.   Always call the [facility] if you are having loose stools/diarrhea that you can't get under control.  Loose stools/diarrhea leads to dehydration (loss of water) in your body.  We have other options of trying to get the loose stools/diarrhea to stop but you must let us know!   Constipation Sheet        Colace - 100 mg capsules - take 2 capsules daily.  If this doesn't help then you can increase to 2 capsules twice daily.        Please call if the above does  not work for you. Do not go more than 2 days without a bowel movement.  It is very important that you do not become constipated.  It will make you feel sick to your stomach (nausea) and can cause abdominal pain and vomiting.   Nausea Sheet        Compazine/Prochlorperazine 10mg  tablet. Take 1 tablet every 6 hours as needed for nausea/vomiting (This can make you drowsy).  If you are having persistent nausea (nausea that does not stop) please call the [place] and let us know the amount of nausea that you are experiencing.  If you begin to vomit, you  need to call the [facility] and if it is the weekend and you have vomited more than one time and can't get it to stop-go to the Emergency Room.  Persistent nausea/vomiting can lead to dehydration (loss of fluid in your body) and will make you feel very weak and unwell. Ice chips, sips of clear liquids, foods that are at room temperature, crackers, and toast tend to be better tolerated.                                                                                                  SYMPTOMS TO REPORT AS SOON AS POSSIBLE AFTER TREATMENT                                                                                                                                                                                                  - FEVER GREATER THAN 100.5 F                                                 - CHILLS WITH OR WITHOUT FEVER     - NAUSEA AND VOMITING THAT IS NOT CONTROLLED WITH YOUR NAUSEA MEDICATION     - UNUSUAL SHORTNESS OF BREATH     - UNUSUAL BRUISING OR BLEEDING     - TENDERNESS IN MOUTH AND THROAT WITH OR WITHOUT      - PRESENCE OF ULCERS     -  URINARY PROBLEMS     - BOWEL PROBLEMS     - UNUSUAL RASH            Wear comfortable clothing and clothing appropriate for easy access to any Portacath or PICC line. Let us know if there is anything that we can do to make your therapy better!             What to do if you need assistance after hours or on the weekends: CALL [phone].  HOLD on the line, do not hang up.  You will hear multiple messages but at the end you will be connected with a nurse triage line.  They will contact the doctor if necessary.  Most of the time they will be able to assist you.  Do not call the hospital operator.      I have been informed and understand all of the instructions given to me and have received a copy. I have been instructed to call the clinic [phone] or my family physician as soon as possible for continued medical care, if indicated.  I do not have any more questions at this time but understand that I may call the [place] or the Patient Navigator at [phone] during office hours should I have questions or need assistance in obtaining follow-up care.

## 2018-09-17 NOTE — Progress Notes (Signed)
[facility] CSW Progress Notes  Call to patient to complete SW assessment, no answer, left VM w request for return call at patient convenience.  Edwyna Shell, LCSW Clinical Social Worker Phone:  [phone] Cell:  [phone]

## 2018-09-17 NOTE — Progress Notes (Signed)

## 2018-09-18 ENCOUNTER — Telehealth: Payer: Self-pay | Admitting: Cardiovascular Disease

## 2018-09-18 NOTE — Telephone Encounter (Signed)
Patient was put on Eliquis post hospital. Patient is due to see SK on 10/16. Requesting cheaper blood thinner. / tg

## 2018-09-19 MED ORDER — APIXABAN 5 MG PO TABS: 5.0000 mg | ORAL_TABLET | Freq: Two times a day (BID) | ORAL | 3 refills | Status: DC

## 2018-09-19 NOTE — Telephone Encounter (Signed)
Pt made aware. Sent in new RX for eliquis to [facility].

## 2018-09-19 NOTE — Telephone Encounter (Signed)
I reviewed Dr. Jacalyn Lefevre note.  It appears the plan was to stay on amiodarone for a total of 12 weeks.  I would continue Eliquis for at least this long.  She may need an event monitor in the future to determine if long-term anticoagulation will be required, essentially to see if there are recurrences of atrial fibrillation.  It was initially thought that this was only postoperative atrial fibrillation.

## 2018-09-19 NOTE — Telephone Encounter (Signed)
Returned pt call. Pt states that she had a lung surgery and had episodes of afib after. She was put on Eliquis in the hospital and also started on amiodarone. She states that she has only had a few episodes of afib, and is questioning how long she will need to be on a blood thinner for. She was told in hospital that it was more than likely temporary. She has an appointment on 10/16 and her eliquis is too expensive. She doesn't want to to all the assistance information if it is going to temporary. Please advise.

## 2018-09-24 ENCOUNTER — Other Ambulatory Visit (HOSPITAL_COMMUNITY): Payer: Medicare Other

## 2018-09-26 ENCOUNTER — Encounter (HOSPITAL_COMMUNITY): Payer: Self-pay | Admitting: Hematology

## 2018-09-26 ENCOUNTER — Inpatient Hospital Stay (HOSPITAL_COMMUNITY): Payer: Medicare Other

## 2018-09-26 ENCOUNTER — Other Ambulatory Visit: Payer: Self-pay

## 2018-09-26 ENCOUNTER — Inpatient Hospital Stay (HOSPITAL_BASED_OUTPATIENT_CLINIC_OR_DEPARTMENT_OTHER): Payer: Medicare Other | Admitting: Hematology

## 2018-09-26 ENCOUNTER — Encounter (HOSPITAL_COMMUNITY): Payer: Self-pay

## 2018-09-26 VITALS — BP 156/60 | HR 55 | Temp 96.9°F | Resp 20 | Wt 174.2 lb

## 2018-09-26 VITALS — BP 147/57 | HR 58 | Temp 97.6°F | Resp 18

## 2018-09-26 DIAGNOSIS — N289 Disorder of kidney and ureter, unspecified: Secondary | ICD-10-CM | POA: Diagnosis not present

## 2018-09-26 DIAGNOSIS — R0609 Other forms of dyspnea: Secondary | ICD-10-CM | POA: Diagnosis not present

## 2018-09-26 DIAGNOSIS — Z5189 Encounter for other specified aftercare: Secondary | ICD-10-CM

## 2018-09-26 DIAGNOSIS — C3491 Malignant neoplasm of unspecified part of right bronchus or lung: Secondary | ICD-10-CM

## 2018-09-26 DIAGNOSIS — E876 Hypokalemia: Secondary | ICD-10-CM | POA: Diagnosis not present

## 2018-09-26 DIAGNOSIS — Z87891 Personal history of nicotine dependence: Secondary | ICD-10-CM | POA: Diagnosis not present

## 2018-09-26 DIAGNOSIS — C3411 Malignant neoplasm of upper lobe, right bronchus or lung: Secondary | ICD-10-CM | POA: Diagnosis not present

## 2018-09-26 LAB — CBC WITH DIFFERENTIAL/PLATELET
Abs Immature Granulocytes: 0.01 10*3/uL (ref 0.00–0.07)
Basophils Absolute: 0 10*3/uL (ref 0.0–0.1)
Basophils Relative: 1 %
Eosinophils Absolute: 0.1 10*3/uL (ref 0.0–0.5)
Eosinophils Relative: 2 %
HCT: 40.9 % (ref 36.0–46.0)
Hemoglobin: 13 g/dL (ref 12.0–15.0)
Immature Granulocytes: 0 %
Lymphocytes Relative: 44 %
Lymphs Abs: 1.4 10*3/uL (ref 0.7–4.0)
MCH: 31.7 pg (ref 26.0–34.0)
MCHC: 31.8 g/dL (ref 30.0–36.0)
MCV: 99.8 fL (ref 80.0–100.0)
Monocytes Absolute: 0.3 10*3/uL (ref 0.1–1.0)
Monocytes Relative: 10 %
Neutro Abs: 1.3 10*3/uL — ABNORMAL LOW (ref 1.7–7.7)
Neutrophils Relative %: 43 %
Platelets: 157 10*3/uL (ref 150–400)
RBC: 4.1 MIL/uL (ref 3.87–5.11)
RDW: 13.4 % (ref 11.5–15.5)
WBC: 3.1 10*3/uL — ABNORMAL LOW (ref 4.0–10.5)
nRBC: 0 % (ref 0.0–0.2)

## 2018-09-26 LAB — COMPREHENSIVE METABOLIC PANEL
ALT: 20 U/L (ref 0–44)
AST: 26 U/L (ref 15–41)
Albumin: 4.1 g/dL (ref 3.5–5.0)
Alkaline Phosphatase: 68 U/L (ref 38–126)
Anion gap: 9 (ref 5–15)
BUN: 16 mg/dL (ref 8–23)
CO2: 25 mmol/L (ref 22–32)
Calcium: 9.3 mg/dL (ref 8.9–10.3)
Chloride: 105 mmol/L (ref 98–111)
Creatinine, Ser: 1.29 mg/dL — ABNORMAL HIGH (ref 0.44–1.00)
GFR calc Af Amer: 49 mL/min — ABNORMAL LOW (ref >60)
GFR calc non Af Amer: 43 mL/min — ABNORMAL LOW (ref >60)
Glucose, Bld: 94 mg/dL (ref 70–99)
Potassium: 4.3 mmol/L (ref 3.5–5.1)
Sodium: 139 mmol/L (ref 135–145)
Total Bilirubin: 0.3 mg/dL (ref 0.3–1.2)
Total Protein: 7.3 g/dL (ref 6.5–8.1)

## 2018-09-26 MED ORDER — SODIUM CHLORIDE 0.9% FLUSH: 10.0000 mL | INTRAVENOUS | Status: DC | PRN

## 2018-09-26 MED ORDER — SODIUM CHLORIDE 0.9 % IV SOLN
Freq: Once | INTRAVENOUS | Status: AC
  Administered 2018-09-26: 10:00:00 via INTRAVENOUS

## 2018-09-26 MED ORDER — SODIUM CHLORIDE 0.9 % IV SOLN
520.0000 mg/m2 | Freq: Once | INTRAVENOUS | Status: AC
  Administered 2018-09-26: 12:00:00 1000 mg via INTRAVENOUS
  Filled 2018-09-26: qty 40

## 2018-09-26 MED ORDER — PEGFILGRASTIM 6 MG/0.6ML ~~LOC~~ PSKT
6.0000 mg | PREFILLED_SYRINGE | Freq: Once | SUBCUTANEOUS | Status: AC
  Administered 2018-09-26: 6 mg via SUBCUTANEOUS
  Filled 2018-09-26: qty 0.6

## 2018-09-26 MED ORDER — HEPARIN SOD (PORK) LOCK FLUSH 100 UNIT/ML IV SOLN: 500.0000 [IU] | Freq: Once | INTRAVENOUS | Status: DC | PRN

## 2018-09-26 MED ORDER — PALONOSETRON HCL INJECTION 0.25 MG/5ML
0.2500 mg | Freq: Once | INTRAVENOUS | Status: AC
  Administered 2018-09-26: 11:00:00 0.25 mg via INTRAVENOUS
  Filled 2018-09-26: qty 5

## 2018-09-26 MED ORDER — SODIUM CHLORIDE 0.9 % IV SOLN
Freq: Once | INTRAVENOUS | Status: AC
  Administered 2018-09-26: 11:00:00 via INTRAVENOUS
  Filled 2018-09-26: qty 5

## 2018-09-26 MED ORDER — SODIUM CHLORIDE 0.9 % IV SOLN
383.5000 mg | Freq: Once | INTRAVENOUS | Status: AC
  Administered 2018-09-26: 380 mg via INTRAVENOUS
  Filled 2018-09-26: qty 38

## 2018-09-26 NOTE — Assessment & Plan Note (Signed)
1.  T3N0 right lung adenocarcinoma: -PET scan on seven 22,020-1.5 x 0.7 cm right upper lobe nodule, SUV 2.3.  Part solid 0.6 cm right upper lobe pulmonary nodule, maximum SUV 1.2. - Status post resection on 08/23/2018 with pathology showing adenocarcinoma, 2 tumors, 1.3 cm and 0.7 cm.  Margins negative.  0/15 lymph nodes positive. -PDL 1 TPS 1%.  Foundation 1 shows TMB low, MS-stable.  No other targetable mutations. - I have recommended adjuvant chemotherapy with 4 cycles based on NCCN recommendations.  She is not a candidate for cisplatin given her mild renal insufficiency. -I have recommended carboplatin and pemetrexed.  We have given B12 injection 2 weeks ago.  She is also taking folic acid daily. -We talked about side effects of this regimen in detail. -We reviewed her blood work today.  She has mild neutropenia with ANC of 1350. -The neutropenia started only after she was started on amiodarone.  Prior to that her [place]  was normal. -Hence I recommended proceeding with the chemotherapy first cycle today.  I plan to add Neulasta on pro to her regimen. -I will see her back in 1 week for follow-up to see how she is tolerating it.

## 2018-09-26 NOTE — Progress Notes (Signed)
Labs reviewed with MD today at office visit, Abs. Neutophils 1.3 noted by MD and K+3.1. Proceed with day one of treatment today.  Treatment given per orders. Patient tolerated it well without problems. Vitals stable and discharged home from clinic ambulatory. Follow up as scheduled.   Patient watched video on Neulasta OnPro. Went over in detail with her. She verbalized understanding.  [place] arrived today for [facility] neulasta on body injector. See MAR for administration details. Injector in place and engaged with green light indicator on flashing. Tolerated application with out problems.

## 2018-09-26 NOTE — Progress Notes (Signed)
[facility] [place], [place] [address]   CLINIC:  Medical Oncology/Hematology  PCP:  Scherrie Bateman [place] [id number] [phone]   REASON FOR VISIT:  To initiate first cycle of adjuvant chemotherapy.   INTERVAL HISTORY:  Cindy Butler 68 y.o. female seen for follow-up of right lung cancer.  She had resection done on 08/23/2018.  She has recovered well.  She was told to continue amiodarone and Eliquis for 12 weeks since the start.  She was started on these medications while she was in the hospital after surgery.  Denies any chest pains at this time.  Shortness of breath on exertion present.  Appetite is 100%.  Energy levels are 75%.    REVIEW OF SYSTEMS:  Review of Systems  Respiratory: Positive for shortness of breath.   All other systems reviewed and are negative.    PAST MEDICAL/SURGICAL HISTORY:  Past Medical History:  Diagnosis Date  . Esophageal reflux   . Hyperlipidemia   . Osteoporosis    Past Surgical History:  Procedure Laterality Date  . COLONOSCOPY  09/27/2010   Procedure: COLONOSCOPY;  Surgeon: Jamesetta So;  Location: [facility];  Service: Gastroenterology;  Laterality: N/A;  . NASAL SINUS SURGERY    . TUBAL LIGATION    . VIDEO ASSISTED THORACOSCOPY (VATS)/ LOBECTOMY Right 08/23/2018   Procedure: Right Video Assisted Thoracoscopy (Vats) with Right Upper Lobectomy and Node Dissection;  Surgeon: Melrose Nakayama, MD;  Location: [facility] OR;  Service: Thoracic;  Laterality: Right;     SOCIAL HISTORY:  Social History   Socioeconomic History  . Marital status: Married    Spouse name: Aaron Edelman  . Number of children: 3  . Years of education: Not on file  . Highest education level: Not on file  Occupational History  . Occupation: retired  Scientific laboratory technician  . Financial resource strain: Not hard at all  . Food insecurity    Worry: Never true    Inability: Never true  . Transportation needs   Medical: No    Non-medical: No  Tobacco Use  . Smoking status: Former Smoker    Packs/day: 0.50    Years: 55.00    Pack years: 27.50    Types: Cigarettes    Quit date: 08/16/2018    Years since quitting: 0.1  . Smokeless tobacco: Never Used  Substance and Sexual Activity  . Alcohol use: Yes    Alcohol/week: 3.0 standard drinks    Types: 3 Standard drinks or equivalent per week  . Drug use: Never  . Sexual activity: Not on file  Lifestyle  . Physical activity    Days per week: 0 days    Minutes per session: 0 min  . Stress: Not at all  Relationships  . Social Herbalist on phone: Twice a week    Gets together: Once a week    Attends religious service: 1 to 4 times per year    Active member of club or organization: No    Attends meetings of clubs or organizations: Never    Relationship status: Married  . Intimate partner violence    Fear of current or ex partner: No    Emotionally abused: No    Physically abused: No    Forced sexual activity: No  Other Topics Concern  . Not on file  Social History Narrative  . Not on file    FAMILY HISTORY:  Family History  Problem Relation Age of Onset  . Bone cancer Mother   . Alcohol abuse Father   . Melanoma Brother   . Breast cancer Maternal Grandmother     CURRENT MEDICATIONS:  Outpatient Encounter Medications as of 09/26/2018  Medication Sig  . amiodarone (PACERONE) 200 MG tablet Take 1 tablet (200 mg total) by mouth 2 (two) times daily. For one week;then take Amiodarone 200 mg daily thereafter  . apixaban (ELIQUIS) 5 MG TABS tablet Take 1 tablet (5 mg total) by mouth 2 (two) times daily.  . Calcium Carb-Cholecalciferol (CALCIUM 600+D) 600-800 MG-UNIT TABS Take 1 tablet by mouth daily.  [place] Kitchen CARBOPLATIN IV Inject into the vein every 21 ( twenty-one) days.  . ferrous sulfate 325 (65 FE) MG tablet Take 325 mg by mouth daily with breakfast.  . folic acid (FOLVITE) 1 MG tablet Take 1 tablet (1 mg total) by mouth daily.   Mar[place] Kitchen omeprazole (PRILOSEC) 40 MG capsule Take 40 mg by mouth daily.   Mar[place] Kitchen PEMEtrexed 500 mg/m2 in sodium chloride 0.9 % 100 mL Inject 500 mg/m2 into the vein every 21 ( twenty-one) days.  . Potassium 99 MG TABS Take 99 mg by mouth daily.  . simvastatin (ZOCOR) 20 MG tablet Take 20 mg by mouth daily at 6 PM.   . prochlorperazine (COMPAZINE) 10 MG tablet Take 1 tablet (10 mg total) by mouth every 6 (six) hours as needed (Nausea or vomiting). (Patient not taking: Reported on 09/26/2018)  . traMADol (ULTRAM) 50 MG tablet Take 1 tablet (50 mg total) by mouth every 6 (six) hours as needed (mild pain). (Patient not taking: Reported on 09/26/2018)   No facility-administered encounter medications on file as of 09/26/2018.     ALLERGIES:  No Known Allergies   PHYSICAL EXAM:  ECOG Performance status: 1  Vitals:   09/26/18 0910  BP: (!) 156/60  Pulse: (!) 55  Resp: 20  Temp: (!) 96.9 F (36.1 C)  SpO2: 95%   Filed Weights   09/26/18 0910  Weight: 174 lb 3.2 oz (79 kg)    Physical Exam Vitals signs reviewed.  Constitutional:      Appearance: Normal appearance.  Cardiovascular:     Rate and Rhythm: Normal rate and regular rhythm.     Heart sounds: Normal heart sounds.  Pulmonary:     Effort: Pulmonary effort is normal.     Breath sounds: Normal breath sounds.  Abdominal:     General: There is no distension.     Palpations: Abdomen is soft. There is no mass.  Musculoskeletal:        General: No swelling.  Lymphadenopathy:     Cervical: No cervical adenopathy.  Skin:    General: Skin is warm.  Neurological:     General: No focal deficit present.     Mental Status: She is alert and oriented to person, place, and time.  Psychiatric:        Mood and Affect: Mood normal.        Behavior: Behavior normal.      LABORATORY DATA:  I have reviewed the labs as listed.  CBC    Component Value Date/Time   WBC 3.1 (L) 09/26/2018 0846   RBC 4.10 09/26/2018 0846   HGB 13.0 09/26/2018  0846   HCT 40.9 09/26/2018 0846   PLT 157 09/26/2018 0846   MCV 99.8 09/26/2018 0846   MCH 31.7 09/26/2018 0846   MCHC 31.8 09/26/2018 0846   RDW 13.4 09/26/2018 0846   LYMPHSABS  1.4 09/26/2018 0846   MONOABS 0.3 09/26/2018 0846   EOSABS 0.1 09/26/2018 0846   BASOSABS 0.0 09/26/2018 0846   CMP Latest Ref Rng & Units 09/26/2018 09/12/2018 08/29/2018  Glucose 70 - 99 mg/dL 94 104(H) 105(H)  BUN 8 - 23 mg/dL '16 23 9  '$ Creatinine 0.44 - 1.00 mg/dL 1.29(H) 1.30(H) 1.06(H)  Sodium 135 - 145 mmol/L 139 139 139  Potassium 3.5 - 5.1 mmol/L 4.3 4.9 3.7  Chloride 98 - 111 mmol/L 105 107 105  CO2 22 - 32 mmol/L '25 26 25  '$ Calcium 8.9 - 10.3 mg/dL 9.3 9.7 8.5(L)  Total Protein 6.5 - 8.1 g/dL 7.3 7.4 -  Total Bilirubin 0.3 - 1.2 mg/dL 0.3 0.4 -  Alkaline Phos 38 - 126 U/L 68 83 -  AST 15 - 41 U/L 26 26 -  ALT 0 - 44 U/L 20 22 -       DIAGNOSTIC IMAGING:  I have independently reviewed the scans and discussed with the patient.   I have reviewed Venita Lick LPN's note and agree with the documentation.  I personally performed a face-to-face visit, made revisions and my assessment and plan is as follows.    ASSESSMENT & PLAN:   Adenocarcinoma of right lung ([place]) 1.  T3N0 right lung adenocarcinoma: -PET scan on seven 22,020-1.5 x 0.7 cm right upper lobe nodule, SUV 2.3.  Part solid 0.6 cm right upper lobe pulmonary nodule, maximum SUV 1.2. - Status post resection on 08/23/2018 with pathology showing adenocarcinoma, 2 tumors, 1.3 cm and 0.7 cm.  Margins negative.  0/15 lymph nodes positive. -PDL 1 TPS 1%.  Foundation 1 shows TMB low, MS-stable.  No other targetable mutations. - I have recommended adjuvant chemotherapy with 4 cycles based on NCCN recommendations.  She is not a candidate for cisplatin given her mild renal insufficiency. -I have recommended carboplatin and pemetrexed.  We have given B12 injection 2 weeks ago.  She is also taking folic acid daily. -We talked about side effects of  this regimen in detail. -We reviewed her blood work today.  She has mild neutropenia with ANC of 1350. -The neutropenia started only after she was started on amiodarone.  Prior to that her [place]  was normal. -Hence I recommended proceeding with the chemotherapy first cycle today.  I plan to add Neulasta on pro to her regimen. -I will see her back in 1 week for follow-up to see how she is tolerating it.  Total time spent is 40 minutes with more than 50% of the time spent face-to-face discussing treatment plan, counseling and coordination of care.  Orders placed this encounter:  Orders Placed This Encounter  Procedures  . CBC with Differential/Platelet  . Comprehensive metabolic panel      Derek Jack, MD [place] [phone]

## 2018-09-26 NOTE — Patient Instructions (Signed)
[facility] Discharge Instructions for Patients Receiving Chemotherapy  Today you received the following chemotherapy agents   To help prevent nausea and vomiting after your treatment, we encourage you to take your nausea medication   If you develop nausea and vomiting that is not controlled by your nausea medication, call the clinic.   BELOW ARE SYMPTOMS THAT SHOULD BE REPORTED IMMEDIATELY:  *FEVER GREATER THAN 100.5 F  *CHILLS WITH OR WITHOUT FEVER  NAUSEA AND VOMITING THAT IS NOT CONTROLLED WITH YOUR NAUSEA MEDICATION  *UNUSUAL SHORTNESS OF BREATH  *UNUSUAL BRUISING OR BLEEDING  TENDERNESS IN MOUTH AND THROAT WITH OR WITHOUT PRESENCE OF ULCERS  *URINARY PROBLEMS  *BOWEL PROBLEMS  UNUSUAL RASH Items with * indicate a potential emergency and should be followed up as soon as possible.  Feel free to call the clinic should you have any questions or concerns. The clinic phone number is [phone].  Please show the CHEMO ALERT CARD at check-in to the Emergency Department and triage nurse.   

## 2018-09-26 NOTE — Patient Instructions (Signed)
[place] at [facility] Discharge Instructions  You were seen today by Dr. Delton Coombes. He went over your recent lab results. He will see you back in 1 week for labs and follow up.  Take your nausea medication at least 3 times a day for the 3 days to help prevent nausea. Take a stool softener daily to help prevent constipation. Take tylenol if you have aches and pains after your Neulasta injection.  Thank you for choosing [place] at [facility] to provide your oncology and hematology care.  To afford each patient quality time with our provider, please arrive at least 15 minutes before your scheduled appointment time.   If you have a lab appointment with the [facility] please come in thru the  Main Entrance and check in at the main information desk  You need to re-schedule your appointment should you arrive 10 or more minutes late.  We strive to give you quality time with our providers, and arriving late affects you and other patients whose appointments are after yours.  Also, if you no show three or more times for appointments you may be dismissed from the clinic at the providers discretion.     Again, thank you for choosing [facility].  Our hope is that these requests will decrease the amount of time that you wait before being seen by our physicians.       _____________________________________________________________  Should you have questions after your visit to [facility], please contact our office at [phone] between the hours of 8:00 a.m. and 4:30 p.m.  Voicemails left after 4:00 p.m. will not be returned until the following business day.  For prescription refill requests, have your pharmacy contact our office and allow 72 hours.    Cancer Center Support Programs:   > Cancer Support Group  2nd Tuesday of the month 1pm-2pm, Journey Room

## 2018-09-27 ENCOUNTER — Telehealth (HOSPITAL_COMMUNITY): Payer: Self-pay

## 2018-09-27 NOTE — Telephone Encounter (Signed)
Chemotherapy 24 hour post follow up call.  Spoke with the patient.  Stated she is doing well with no complaints voiced.  Denied pain, SOB, nausea, and body aches.  Reminded the patient how to call the clinic after hours with understanding verbalized.

## 2018-10-01 ENCOUNTER — Ambulatory Visit (HOSPITAL_COMMUNITY): Payer: Medicare Other | Admitting: Hematology

## 2018-10-01 ENCOUNTER — Other Ambulatory Visit (HOSPITAL_COMMUNITY): Payer: Medicare Other

## 2018-10-03 ENCOUNTER — Other Ambulatory Visit: Payer: Self-pay

## 2018-10-03 ENCOUNTER — Inpatient Hospital Stay (HOSPITAL_COMMUNITY): Payer: Medicare Other

## 2018-10-03 ENCOUNTER — Inpatient Hospital Stay (HOSPITAL_BASED_OUTPATIENT_CLINIC_OR_DEPARTMENT_OTHER): Payer: Medicare Other | Admitting: Hematology

## 2018-10-03 VITALS — BP 145/50 | HR 58 | Temp 97.7°F | Resp 18

## 2018-10-03 DIAGNOSIS — C3491 Malignant neoplasm of unspecified part of right bronchus or lung: Secondary | ICD-10-CM

## 2018-10-03 DIAGNOSIS — C3411 Malignant neoplasm of upper lobe, right bronchus or lung: Secondary | ICD-10-CM | POA: Diagnosis not present

## 2018-10-03 DIAGNOSIS — E876 Hypokalemia: Secondary | ICD-10-CM | POA: Diagnosis not present

## 2018-10-03 DIAGNOSIS — E875 Hyperkalemia: Secondary | ICD-10-CM

## 2018-10-03 DIAGNOSIS — N289 Disorder of kidney and ureter, unspecified: Secondary | ICD-10-CM | POA: Diagnosis not present

## 2018-10-03 DIAGNOSIS — Z87891 Personal history of nicotine dependence: Secondary | ICD-10-CM | POA: Diagnosis not present

## 2018-10-03 DIAGNOSIS — R0609 Other forms of dyspnea: Secondary | ICD-10-CM | POA: Diagnosis not present

## 2018-10-03 LAB — CBC WITH DIFFERENTIAL/PLATELET
Abs Immature Granulocytes: 0.01 10*3/uL (ref 0.00–0.07)
Basophils Absolute: 0 10*3/uL (ref 0.0–0.1)
Basophils Relative: 1 %
Eosinophils Absolute: 0.1 10*3/uL (ref 0.0–0.5)
Eosinophils Relative: 9 %
HCT: 37.1 % (ref 36.0–46.0)
Hemoglobin: 11.9 g/dL — ABNORMAL LOW (ref 12.0–15.0)
Immature Granulocytes: 1 %
Lymphocytes Relative: 47 %
Lymphs Abs: 0.7 10*3/uL (ref 0.7–4.0)
MCH: 32 pg (ref 26.0–34.0)
MCHC: 32.1 g/dL (ref 30.0–36.0)
MCV: 99.7 fL (ref 80.0–100.0)
Monocytes Absolute: 0.2 10*3/uL (ref 0.1–1.0)
Monocytes Relative: 13 %
Neutro Abs: 0.4 10*3/uL — ABNORMAL LOW (ref 1.7–7.7)
Neutrophils Relative %: 29 %
Platelets: 68 10*3/uL — ABNORMAL LOW (ref 150–400)
RBC: 3.72 MIL/uL — ABNORMAL LOW (ref 3.87–5.11)
RDW: 12.8 % (ref 11.5–15.5)
WBC: 1.4 10*3/uL — CL (ref 4.0–10.5)
nRBC: 0 % (ref 0.0–0.2)

## 2018-10-03 LAB — COMPREHENSIVE METABOLIC PANEL
ALT: 32 U/L (ref 0–44)
AST: 30 U/L (ref 15–41)
Albumin: 3.9 g/dL (ref 3.5–5.0)
Alkaline Phosphatase: 87 U/L (ref 38–126)
Anion gap: 7 (ref 5–15)
BUN: 26 mg/dL — ABNORMAL HIGH (ref 8–23)
CO2: 26 mmol/L (ref 22–32)
Calcium: 9.4 mg/dL (ref 8.9–10.3)
Chloride: 104 mmol/L (ref 98–111)
Creatinine, Ser: 1.47 mg/dL — ABNORMAL HIGH (ref 0.44–1.00)
GFR calc Af Amer: 42 mL/min — ABNORMAL LOW (ref >60)
GFR calc non Af Amer: 36 mL/min — ABNORMAL LOW (ref >60)
Glucose, Bld: 99 mg/dL (ref 70–99)
Potassium: 5.6 mmol/L — ABNORMAL HIGH (ref 3.5–5.1)
Sodium: 137 mmol/L (ref 135–145)
Total Bilirubin: 0.8 mg/dL (ref 0.3–1.2)
Total Protein: 7.2 g/dL (ref 6.5–8.1)

## 2018-10-03 MED ORDER — INSULIN REGULAR HUMAN 100 UNIT/ML IJ SOLN
10.0000 [IU] | Freq: Once | INTRAMUSCULAR | Status: AC
  Administered 2018-10-03: 10 [IU] via SUBCUTANEOUS
  Filled 2018-10-03: qty 0.1

## 2018-10-03 MED ORDER — SODIUM CHLORIDE 0.9 % IV SOLN
INTRAVENOUS | Status: AC
  Administered 2018-10-03: 10:00:00 via INTRAVENOUS

## 2018-10-03 MED ORDER — SODIUM POLYSTYRENE SULFONATE 15 GM/60ML PO SUSP
30.0000 g | Freq: Once | ORAL | Status: AC
  Administered 2018-10-03: 30 g via ORAL
  Filled 2018-10-03: qty 120

## 2018-10-03 MED ORDER — DEXTROSE 50 % IV SOLN
50.0000 mL | Freq: Once | INTRAVENOUS | Status: AC
  Administered 2018-10-03: 11:00:00 50 mL via INTRAVENOUS
  Filled 2018-10-03: qty 50

## 2018-10-03 NOTE — Progress Notes (Signed)
10/03/18  K+ 5.6  MD Ordered:  Regular Insulin 10 units IV x 1 Dextrose 50% 1 amp (74ml) x 1 Kayexalate 30 gm oral x 1 NS 0.9% 500 ml IV x 1  T.O. Dr Beckey Downing LPN/Patience Nuzzo Ronnald Ramp, PharmD

## 2018-10-03 NOTE — Progress Notes (Signed)
CRITICAL VALUE ALERT  Critical Value:  WBC 1.4  Date & Time Notied:  10/03/2018 at [place]  Provider Notified: Dr. Delton Coombes  Orders Received/Actions taken: n/a

## 2018-10-03 NOTE — Progress Notes (Signed)
Cindy Butler, [place] [address]   CLINIC:  Medical Oncology/Hematology  PCP:  Scherrie Bateman [place] [id number] [phone]   REASON FOR VISIT:  To initiate first cycle of adjuvant chemotherapy.   INTERVAL HISTORY:  Cindy Butler 68 y.o. female seen for follow-up of right lung adenocarcinoma.  She did receive first cycle on 09/26/2018.  She did not experience any nausea but had regurgitation of food.  She was feeling full after eating and the food came back up.  Denied any diarrhea or constipation.  Appetite and energy levels are 75%.  No pain is reported.  Mild fatigue is new.  Denies any fevers or chills.    REVIEW OF SYSTEMS:  Review of Systems  Constitutional: Positive for fatigue.  Gastrointestinal: Positive for vomiting.  All other systems reviewed and are negative.    PAST MEDICAL/SURGICAL HISTORY:  Past Medical History:  Diagnosis Date  . Esophageal reflux   . Hyperlipidemia   . Osteoporosis    Past Surgical History:  Procedure Laterality Date  . COLONOSCOPY  09/27/2010   Procedure: COLONOSCOPY;  Surgeon: Jamesetta So;  [facility];  Service: Gastroenterology;  Laterality: N/A;  . NASAL SINUS SURGERY    . TUBAL LIGATION    . VIDEO ASSISTED THORACOSCOPY (VATS)/ LOBECTOMY Right 08/23/2018   Procedure: Right Video Assisted Thoracoscopy (Vats) with Right Upper Lobectomy and Node Dissection;  Surgeon: Melrose Nakayama, MD;  Location: [facility] OR;  Service: Thoracic;  Laterality: Right;     SOCIAL HISTORY:  Social History   Socioeconomic History  . Marital status: Married    Spouse name: Aaron Edelman  . Number of children: 3  . Years of education: Not on file  . Highest education level: Not on file  Occupational History  . Occupation: retired  Scientific laboratory technician  . Financial resource strain: Not hard at all  . Food insecurity    Worry: Never true    Inability: Never true  .  Transportation needs    Medical: No    Non-medical: No  Tobacco Use  . Smoking status: Former Smoker    Packs/day: 0.50    Years: 55.00    Pack years: 27.50    Types: Cigarettes    Quit date: 08/16/2018    Years since quitting: 0.1  . Smokeless tobacco: Never Used  Substance and Sexual Activity  . Alcohol use: Yes    Alcohol/week: 3.0 standard drinks    Types: 3 Standard drinks or equivalent per week  . Drug use: Never  . Sexual activity: Not on file  Lifestyle  . Physical activity    Days per week: 0 days    Minutes per session: 0 min  . Stress: Not at all  Relationships  . Social Herbalist on phone: Twice a week    Gets together: Once a week    Attends religious service: 1 to 4 times per year    Active member of club or organization: No    Attends meetings of clubs or organizations: Never    Relationship status: Married  . Intimate partner violence    Fear of current or ex partner: No    Emotionally abused: No    Physically abused: No    Forced sexual activity: No  Other Topics Concern  . Not on file  Social History Narrative  . Not on file    FAMILY HISTORY:  Family History  Problem Relation Age of Onset  . Bone cancer Mother   . Alcohol abuse Father   . Melanoma Brother   . Breast cancer Maternal Grandmother     CURRENT MEDICATIONS:  Outpatient Encounter Medications as of 10/03/2018  Medication Sig  . amiodarone (PACERONE) 200 MG tablet Take 1 tablet (200 mg total) by mouth 2 (two) times daily. For one week;then take Amiodarone 200 mg daily thereafter  . apixaban (ELIQUIS) 5 MG TABS tablet Take 1 tablet (5 mg total) by mouth 2 (two) times daily.  . Calcium Carb-Cholecalciferol (CALCIUM 600+D) 600-800 MG-UNIT TABS Take 1 tablet by mouth daily.  Marland Kitchen CARBOPLATIN IV Inject into the vein every 21 ( twenty-one) days.  . ferrous sulfate 325 (65 FE) MG tablet Take 325 mg by mouth daily with breakfast.  . folic acid (FOLVITE) 1 MG tablet Take 1 tablet (1 mg  total) by mouth daily.  Marland Kitchen omeprazole (PRILOSEC) 40 MG capsule Take 40 mg by mouth daily.   Marland Kitchen PEMEtrexed 500 mg/m2 in sodium chloride 0.9 % 100 mL Inject 500 mg/m2 into the vein every 21 ( twenty-one) days.  . Potassium 99 MG TABS Take 99 mg by mouth daily.  . prochlorperazine (COMPAZINE) 10 MG tablet Take 1 tablet (10 mg total) by mouth every 6 (six) hours as needed (Nausea or vomiting).  . simvastatin (ZOCOR) 20 MG tablet Take 20 mg by mouth daily at 6 PM.   . traMADol (ULTRAM) 50 MG tablet Take 1 tablet (50 mg total) by mouth every 6 (six) hours as needed (mild pain).   No facility-administered encounter medications on file as of 10/03/2018.     ALLERGIES:  No Known Allergies   PHYSICAL EXAM:  ECOG Performance status: 1  Vitals:   10/03/18 0911  BP: (!) 150/49  Pulse: (!) 50  Resp: 18  Temp: 97.8 F (36.6 C)  SpO2: 100%   Filed Weights   10/03/18 0911  Weight: 172 lb 6.4 oz (78.2 kg)    Physical Exam Vitals signs reviewed.  Constitutional:      Appearance: Normal appearance.  Cardiovascular:     Rate and Rhythm: Normal rate and regular rhythm.     Heart sounds: Normal heart sounds.  Pulmonary:     Effort: Pulmonary effort is normal.     Breath sounds: Normal breath sounds.  Abdominal:     General: There is no distension.     Palpations: Abdomen is soft. There is no mass.  Musculoskeletal:        General: No swelling.  Lymphadenopathy:     Cervical: No cervical adenopathy.  Skin:    General: Skin is warm.  Neurological:     General: No focal deficit present.     Mental Status: She is alert and oriented to person, place, and time.  Psychiatric:        Mood and Affect: Mood normal.        Behavior: Behavior normal.      LABORATORY DATA:  I have reviewed the labs as listed.  CBC    Component Value Date/Time   WBC 1.4 (LL) 10/03/2018 0847   RBC 3.72 (L) 10/03/2018 0847   HGB 11.9 (L) 10/03/2018 0847   HCT 37.1 10/03/2018 0847   PLT 68 (L) 10/03/2018  0847   MCV 99.7 10/03/2018 0847   MCH 32.0 10/03/2018 0847   MCHC 32.1 10/03/2018 0847   RDW 12.8 10/03/2018 0847   LYMPHSABS 0.7 10/03/2018 0847   MONOABS 0.2 10/03/2018 0847  EOSABS 0.1 10/03/2018 0847   BASOSABS 0.0 10/03/2018 0847   CMP Latest Ref Rng & Units 10/03/2018 09/26/2018 09/12/2018  Glucose 70 - 99 mg/dL 99 94 104(H)  BUN 8 - 23 mg/dL 26(H) 16 23  Creatinine 0.44 - 1.00 mg/dL 1.47(H) 1.29(H) 1.30(H)  Sodium 135 - 145 mmol/L 137 139 139  Potassium 3.5 - 5.1 mmol/L 5.6(H) 4.3 4.9  Chloride 98 - 111 mmol/L 104 105 107  CO2 22 - 32 mmol/L _0 Calcium 8.9 - 10.3 mg/dL 9.4 9.3 9.7  Total Protein 6.5 - 8.1 g/dL 7.2 7.3 7.4  Total Bilirubin 0.3 - 1.2 mg/dL 0.8 0.3 0.4  Alkaline Phos 38 - 126 U/L 87 68 83  AST 15 - 41 U/L _1 ALT 0 - 44 U/L 32 20 22       DIAGNOSTIC IMAGING:  I have independently reviewed the scans and discussed with the patient.   I have reviewed Venita Lick LPN's note and agree with the documentation.  I personally performed a face-to-face visit, made revisions and my assessment and plan is as follows.    ASSESSMENT & PLAN:   Adenocarcinoma of right lung ([place]) 1.  T3N0 right lung adenocarcinoma: - PET scan on 07/31/2018 shows 1.5 x 0.7 cm right upper lobe nodule, SUV 2.3.  Part solid 0.6 cm right upper lobe pulmonary nodule, SUV 1.2. -Status post resection on 08/23/2018 with pathology showing adenocarcinoma, 2 tumors, 1.3 cm and 0.7 cm.  Margins negative.  0/15 lymph nodes positive. -PDL 1 TPS 1%.  Foundation 1 shows TMB low, MS-stable.  No other targetable mutations. -She was recommended 4 cycles of adjuvant chemotherapy.  She is not a candidate for cisplatin. - Cycle 1 of carboplatin and pemetrexed on 09/26/2018. - She denied any nausea but had episodes of regurgitation of food as she felt full. - We reviewed her blood work.  White count is 1.4 and platelet count is 68.  ANC is 400.  She did receive Neulasta on pro. - I will see her  back in 2 weeks for follow-up and next cycle.  2.  Hyperkalemia/worsening renal function: - Creatinine elevated at 1.47.  Potassium is 5.6. -I have told her to discontinue potassium supplements.  She will receive 1 L of normal saline today. - She will be given insulin 10 units IV with D50 1 amp.  She will also receive Kayexalate.  Total time spent is 25 minutes with more than 50% of the time spent face-to-face discussing treatment plan, counseling and coordination of care.  Orders placed this encounter:  No orders of the defined types were placed in this encounter.     Derek Jack, MD [place] [phone]

## 2018-10-03 NOTE — Progress Notes (Signed)
Patient to treatment area for elevated potassium levels.  Reviewed with Dr. Delton Coombes when to discharge the patient after the dextrose 50% and insulin subcutaneous.  Patient may leave when the hydration is complete verbal order Dr. Delton Coombes.    Reviewed the purpose of the medications and kayexalate with the patient with all questions asked and answered.   Patient given the Kayexalate for home with review how to take the medication.  Patient verbalized understanding.   Patient tolerated hydration with no complaints voiced.  Peripheral IV site clean and dry with no bruising or swelling noted at site.  Good blood return noted before and after infusion.  Band aid applied.  VSS with discharge and left ambulatory with no s/s of distress noted.

## 2018-10-03 NOTE — Patient Instructions (Signed)
[facility] at [facility] Discharge Instructions  You were seen today by Dr. Katragadda. He went over your recent lab results. He will see you back in 2 weeks for labs and follow up.   Thank you for choosing  Cancer Center at [facility] to provide your oncology and hematology care.  To afford each patient quality time with our provider, please arrive at least 15 minutes before your scheduled appointment time.   If you have a lab appointment with the Cancer Center please come in thru the  Main Entrance and check in at the main information desk  You need to re-schedule your appointment should you arrive 10 or more minutes late.  We strive to give you quality time with our providers, and arriving late affects you and other patients whose appointments are after yours.  Also, if you no show three or more times for appointments you may be dismissed from the clinic at the providers discretion.     Again, thank you for choosing [facility].  Our hope is that these requests will decrease the amount of time that you wait before being seen by our physicians.       _____________________________________________________________  Should you have questions after your visit to [facility], please contact our office at [phone] between the hours of 8:00 a.m. and 4:30 p.m.  Voicemails left after 4:00 p.m. will not be returned until the following business day.  For prescription refill requests, have your pharmacy contact our office and allow 72 hours.    Cancer Center Support Programs:   > Cancer Support Group  2nd Tuesday of the month 1pm-2pm, Journey Room    

## 2018-10-06 ENCOUNTER — Encounter (HOSPITAL_COMMUNITY): Payer: Self-pay | Admitting: Hematology

## 2018-10-06 NOTE — Assessment & Plan Note (Signed)
1.  T3N0 right lung adenocarcinoma: - PET scan on 07/31/2018 shows 1.5 x 0.7 cm right upper lobe nodule, SUV 2.3.  Part solid 0.6 cm right upper lobe pulmonary nodule, SUV 1.2. -Status post resection on 08/23/2018 with pathology showing adenocarcinoma, 2 tumors, 1.3 cm and 0.7 cm.  Margins negative.  0/15 lymph nodes positive. -PDL 1 TPS 1%.  Foundation 1 shows TMB low, MS-stable.  No other targetable mutations. -She was recommended 4 cycles of adjuvant chemotherapy.  She is not a candidate for cisplatin. - Cycle 1 of carboplatin and pemetrexed on 09/26/2018. - She denied any nausea but had episodes of regurgitation of food as she felt full. - We reviewed her blood work.  White count is 1.4 and platelet count is 68.  ANC is 400.  She did receive Neulasta on pro. - I will see her back in 2 weeks for follow-up and next cycle.  2.  Hyperkalemia/worsening renal function: - Creatinine elevated at 1.47.  Potassium is 5.6. -I have told her to discontinue potassium supplements.  She will receive 1 L of normal saline today. - She will be given insulin 10 units IV with D50 1 amp.  She will also receive Kayexalate.

## 2018-10-15 ENCOUNTER — Telehealth: Payer: Self-pay | Admitting: Cardiovascular Disease

## 2018-10-15 MED ORDER — AMIODARONE HCL 200 MG PO TABS: 200.0000 mg | ORAL_TABLET | Freq: Two times a day (BID) | ORAL | 0 refills | Status: DC

## 2018-10-15 NOTE — Telephone Encounter (Signed)
Refill complete 

## 2018-10-15 NOTE — Telephone Encounter (Signed)
Needing refill for amiodarone (PACERONE) 200 MG tablet [[id number]]  Sent to [place], she has an apt w/ SK on 10/25/2018

## 2018-10-16 ENCOUNTER — Other Ambulatory Visit: Payer: Self-pay

## 2018-10-16 ENCOUNTER — Inpatient Hospital Stay (HOSPITAL_COMMUNITY): Payer: Medicare Other | Attending: Hematology

## 2018-10-16 ENCOUNTER — Encounter (HOSPITAL_COMMUNITY): Payer: Self-pay | Admitting: Hematology

## 2018-10-16 ENCOUNTER — Inpatient Hospital Stay (HOSPITAL_BASED_OUTPATIENT_CLINIC_OR_DEPARTMENT_OTHER): Payer: Medicare Other | Admitting: Hematology

## 2018-10-16 ENCOUNTER — Inpatient Hospital Stay (HOSPITAL_COMMUNITY): Payer: Medicare Other

## 2018-10-16 DIAGNOSIS — Z5111 Encounter for antineoplastic chemotherapy: Secondary | ICD-10-CM | POA: Insufficient documentation

## 2018-10-16 DIAGNOSIS — C3411 Malignant neoplasm of upper lobe, right bronchus or lung: Secondary | ICD-10-CM | POA: Insufficient documentation

## 2018-10-16 DIAGNOSIS — Z5189 Encounter for other specified aftercare: Secondary | ICD-10-CM | POA: Diagnosis not present

## 2018-10-16 DIAGNOSIS — C3491 Malignant neoplasm of unspecified part of right bronchus or lung: Secondary | ICD-10-CM

## 2018-10-16 LAB — CBC WITH DIFFERENTIAL/PLATELET
Abs Immature Granulocytes: 0.02 10*3/uL (ref 0.00–0.07)
Basophils Absolute: 0 10*3/uL (ref 0.0–0.1)
Basophils Relative: 1 %
Eosinophils Absolute: 0 10*3/uL (ref 0.0–0.5)
Eosinophils Relative: 1 %
HCT: 34.3 % — ABNORMAL LOW (ref 36.0–46.0)
Hemoglobin: 10.9 g/dL — ABNORMAL LOW (ref 12.0–15.0)
Immature Granulocytes: 1 %
Lymphocytes Relative: 31 %
Lymphs Abs: 1.4 10*3/uL (ref 0.7–4.0)
MCH: 31.8 pg (ref 26.0–34.0)
MCHC: 31.8 g/dL (ref 30.0–36.0)
MCV: 100 fL (ref 80.0–100.0)
Monocytes Absolute: 0.4 10*3/uL (ref 0.1–1.0)
Monocytes Relative: 10 %
Neutro Abs: 2.5 10*3/uL (ref 1.7–7.7)
Neutrophils Relative %: 56 %
Platelets: 357 10*3/uL (ref 150–400)
RBC: 3.43 MIL/uL — ABNORMAL LOW (ref 3.87–5.11)
RDW: 13.4 % (ref 11.5–15.5)
WBC: 4.4 10*3/uL (ref 4.0–10.5)
nRBC: 0 % (ref 0.0–0.2)

## 2018-10-16 LAB — COMPREHENSIVE METABOLIC PANEL
ALT: 38 U/L (ref 0–44)
AST: 34 U/L (ref 15–41)
Albumin: 3.8 g/dL (ref 3.5–5.0)
Alkaline Phosphatase: 88 U/L (ref 38–126)
Anion gap: 10 (ref 5–15)
BUN: 19 mg/dL (ref 8–23)
CO2: 23 mmol/L (ref 22–32)
Calcium: 9.1 mg/dL (ref 8.9–10.3)
Chloride: 107 mmol/L (ref 98–111)
Creatinine, Ser: 1.33 mg/dL — ABNORMAL HIGH (ref 0.44–1.00)
GFR calc Af Amer: 47 mL/min — ABNORMAL LOW (ref >60)
GFR calc non Af Amer: 41 mL/min — ABNORMAL LOW (ref >60)
Glucose, Bld: 111 mg/dL — ABNORMAL HIGH (ref 70–99)
Potassium: 4.3 mmol/L (ref 3.5–5.1)
Sodium: 140 mmol/L (ref 135–145)
Total Bilirubin: 0.3 mg/dL (ref 0.3–1.2)
Total Protein: 7.2 g/dL (ref 6.5–8.1)

## 2018-10-16 MED ORDER — RESTASIS 0.05 % OP EMUL: 1.0000 [drp] | Freq: Two times a day (BID) | OPHTHALMIC | 3 refills | Status: AC

## 2018-10-16 NOTE — Patient Instructions (Addendum)
[place] at [facility] Discharge Instructions  You were seen today by Dr. Delton Coombes. He went over your recent lab results. He will see you back as scheduled tomorrow for your treatment and he will give you extra fluids as well.  He will see you back in 3 weeks for labs, treatment and follow up.   Thank you for choosing [place] at [facility] to provide your oncology and hematology care.  To afford each patient quality time with our provider, please arrive at least 15 minutes before your scheduled appointment time.   If you have a lab appointment with the [place] please come in thru the  Main Entrance and check in at the main information desk  You need to re-schedule your appointment should you arrive 10 or more minutes late.  We strive to give you quality time with our providers, and arriving late affects you and other patients whose appointments are after yours.  Also, if you no show three or more times for appointments you may be dismissed from the clinic at the providers discretion.     Again, thank you for choosing [facility].  Our hope is that these requests will decrease the amount of time that you wait before being seen by our physicians.       _____________________________________________________________  Should you have questions after your visit to [facility], please contact our office at [phone] between the hours of 8:00 a.m. and 4:30 p.m.  Voicemails left after 4:00 p.m. will not be returned until the following business day.  For prescription refill requests, have your pharmacy contact our office and allow 72 hours.    Cancer Center Support Programs:   > Cancer Support Group  2nd Tuesday of the month 1pm-2pm, Journey Room

## 2018-10-16 NOTE — Progress Notes (Signed)
[place], [place] [address]   CLINIC:  Medical Oncology/Hematology  PCP:  Scherrie Bateman [place] [address] [phone]   REASON FOR VISIT:  Cycle 2 of chemotherapy.   INTERVAL HISTORY:  Cindy Butler 68 y.o. female seen for follow-up of chemotherapy for right lung cancer.  She is accompanied by her husband.  Appetite is 100%.  Energy levels are 50%.  Complains of dry eyes with irritation.  She has used over-the-counter eyedrops without help.  She has sleep problems.  She also reports matting of her eyelids when she is using the artificial tears.  Baseline cough is stable.  Denies any nausea, vomiting, diarrhea.    REVIEW OF SYSTEMS:  Review of Systems  Eyes: Positive for eye problems.  Respiratory: Positive for cough.   Psychiatric/Behavioral: Positive for sleep disturbance.  All other systems reviewed and are negative.    PAST MEDICAL/SURGICAL HISTORY:  Past Medical History:  Diagnosis Date  . Esophageal reflux   . Hyperlipidemia   . Osteoporosis    Past Surgical History:  Procedure Laterality Date  . COLONOSCOPY  09/27/2010   Procedure: COLONOSCOPY;  Surgeon: Jamesetta So;  [facility];  Service: Gastroenterology;  Laterality: N/A;  . NASAL SINUS SURGERY    . TUBAL LIGATION    . VIDEO ASSISTED THORACOSCOPY (VATS)/ LOBECTOMY Right 08/23/2018   Procedure: Right Video Assisted Thoracoscopy (Vats) with Right Upper Lobectomy and Node Dissection;  Surgeon: Melrose Nakayama, MD;  Location: [facility] OR;  Service: Thoracic;  Laterality: Right;     SOCIAL HISTORY:  Social History   Socioeconomic History  . Marital status: Married    Spouse name: Aaron Edelman  . Number of children: 3  . Years of education: Not on file  . Highest education level: Not on file  Occupational History  . Occupation: retired  Scientific laboratory technician  . Financial resource strain: Not hard at all  . Food insecurity   Worry: Never true    Inability: Never true  . Transportation needs    Medical: No    Non-medical: No  Tobacco Use  . Smoking status: Former Smoker    Packs/day: 0.50    Years: 55.00    Pack years: 27.50    Types: Cigarettes    Quit date: 08/16/2018    Years since quitting: 0.1  . Smokeless tobacco: Never Used  Substance and Sexual Activity  . Alcohol use: Yes    Alcohol/week: 3.0 standard drinks    Types: 3 Standard drinks or equivalent per week  . Drug use: Never  . Sexual activity: Not on file  Lifestyle  . Physical activity    Days per week: 0 days    Minutes per session: 0 min  . Stress: Not at all  Relationships  . Social Herbalist on phone: Twice a week    Gets together: Once a week    Attends religious service: 1 to 4 times per year    Active member of club or organization: No    Attends meetings of clubs or organizations: Never    Relationship status: Married  . Intimate partner violence    Fear of current or ex partner: No    Emotionally abused: No    Physically abused: No    Forced sexual activity: No  Other Topics Concern  . Not on file  Social History Narrative  . Not on file    FAMILY  HISTORY:  Family History  Problem Relation Age of Onset  . Bone cancer Mother   . Alcohol abuse Father   . Melanoma Brother   . Breast cancer Maternal Grandmother     CURRENT MEDICATIONS:  Outpatient Encounter Medications as of 10/16/2018  Medication Sig  . amiodarone (PACERONE) 200 MG tablet Take 1 tablet (200 mg total) by mouth 2 (two) times daily. For one week;then take Amiodarone 200 mg daily thereafter  . apixaban (ELIQUIS) 5 MG TABS tablet Take 1 tablet (5 mg total) by mouth 2 (two) times daily.  . Calcium Carb-Cholecalciferol (CALCIUM 600+D) 600-800 MG-UNIT TABS Take 1 tablet by mouth daily.  Marland Kitchen CARBOPLATIN IV Inject into the vein every 21 ( twenty-one) days.  . ferrous sulfate 325 (65 FE) MG tablet Take 325 mg by mouth daily with breakfast.  .  folic acid (FOLVITE) 1 MG tablet Take 1 tablet (1 mg total) by mouth daily.  Marland Kitchen omeprazole (PRILOSEC) 40 MG capsule Take 40 mg by mouth daily.   Marland Kitchen PEMEtrexed 500 mg/m2 in sodium chloride 0.9 % 100 mL Inject 500 mg/m2 into the vein every 21 ( twenty-one) days.  . Potassium 99 MG TABS Take 99 mg by mouth daily.  . simvastatin (ZOCOR) 20 MG tablet Take 20 mg by mouth daily at 6 PM.   . cycloSPORINE (RESTASIS) 0.05 % ophthalmic emulsion Place 1 drop into both eyes 2 (two) times daily.  . prochlorperazine (COMPAZINE) 10 MG tablet Take 1 tablet (10 mg total) by mouth every 6 (six) hours as needed (Nausea or vomiting). (Patient not taking: Reported on 10/16/2018)  . traMADol (ULTRAM) 50 MG tablet Take 1 tablet (50 mg total) by mouth every 6 (six) hours as needed (mild pain). (Patient not taking: Reported on 10/16/2018)   No facility-administered encounter medications on file as of 10/16/2018.     ALLERGIES:  No Known Allergies   PHYSICAL EXAM:  ECOG Performance status: 1  Vitals:   10/16/18 0934  BP: 117/82  Pulse: (!) 52  Resp: 18  Temp: (!) 97 F (36.1 C)  SpO2: 100%   Filed Weights   10/16/18 0934  Weight: 175 lb (79.4 kg)    Physical Exam Vitals signs reviewed.  Constitutional:      Appearance: Normal appearance.  Cardiovascular:     Rate and Rhythm: Normal rate and regular rhythm.     Heart sounds: Normal heart sounds.  Pulmonary:     Effort: Pulmonary effort is normal.     Breath sounds: Normal breath sounds.  Abdominal:     General: There is no distension.     Palpations: Abdomen is soft. There is no mass.  Musculoskeletal:        General: No swelling.  Lymphadenopathy:     Cervical: No cervical adenopathy.  Skin:    General: Skin is warm.  Neurological:     General: No focal deficit present.     Mental Status: She is alert and oriented to person, place, and time.  Psychiatric:        Mood and Affect: Mood normal.        Behavior: Behavior normal.       LABORATORY DATA:  I have reviewed the labs as listed.  CBC    Component Value Date/Time   WBC 4.4 10/16/2018 0901   RBC 3.43 (L) 10/16/2018 0901   HGB 10.9 (L) 10/16/2018 0901   HCT 34.3 (L) 10/16/2018 0901   PLT 357 10/16/2018 0901   MCV 100.0 10/16/2018  0901   MCH 31.8 10/16/2018 0901   MCHC 31.8 10/16/2018 0901   RDW 13.4 10/16/2018 0901   LYMPHSABS 1.4 10/16/2018 0901   MONOABS 0.4 10/16/2018 0901   EOSABS 0.0 10/16/2018 0901   BASOSABS 0.0 10/16/2018 0901   CMP Latest Ref Rng & Units 10/16/2018 10/03/2018 09/26/2018  Glucose 70 - 99 mg/dL 111(H) 99 94  BUN 8 - 23 mg/dL 19 26(H) 16  Creatinine 0.44 - 1.00 mg/dL 1.33(H) 1.47(H) 1.29(H)  Sodium 135 - 145 mmol/L 140 137 139  Potassium 3.5 - 5.1 mmol/L 4.3 5.6(H) 4.3  Chloride 98 - 111 mmol/L 107 104 105  CO2 22 - 32 mmol/L '23 26 25  '$ Calcium 8.9 - 10.3 mg/dL 9.1 9.4 9.3  Total Protein 6.5 - 8.1 g/dL 7.2 7.2 7.3  Total Bilirubin 0.3 - 1.2 mg/dL 0.3 0.8 0.3  Alkaline Phos 38 - 126 U/L 88 87 68  AST 15 - 41 U/L 34 30 26  ALT 0 - 44 U/L 38 32 20       DIAGNOSTIC IMAGING:  I have independently reviewed the scans and discussed with the patient.   I have reviewed Venita Lick LPN's note and agree with the documentation.  I personally performed a face-to-face visit, made revisions and my assessment and plan is as follows.    ASSESSMENT & PLAN:   Adenocarcinoma of right lung ([place]) 1.  T3N0 right lung adenocarcinoma: - PET scan on 07/31/2018 shows 1.5 x 0.7 cm right upper lobe nodule, SUV 2.3.  Part solid 0.6 cm right upper lobe pulmonary nodule, SUV 1.2. -Status post resection on 08/23/2018 with pathology showing adenocarcinoma, 2 tumors, 1.3 cm and 0.7 cm.  Margins negative.  0/15 lymph nodes positive. -PDL 1 TPS 1%.  Foundation 1 shows TMB low, MS-stable.  No other targetable mutations. -She was recommended 4 cycles of adjuvant chemotherapy.  She is not a candidate for cisplatin. - Cycle 1 of carboplatin and pemetrexed on  09/26/2018. - She felt very well in the last few weeks.  I have reviewed her labs. -Creatinine is 1.33.  She will receive 100 mL of normal saline along with her chemo. -We will reevaluate her in 3 weeks.  She will proceed with cycle 2 tomorrow.  2.  Hyperkalemia/worsening renal function: - She will receive fluids prior to chemotherapy. -Potassium normalized after we discontinued potassium supplements.  3.  Dry eyes: -She used over-the-counter drops.  Still continues to have worsening dry eyes for the last 2 weeks.  She also reports that she had dry eyes for the last 20 years but not to this extent. -I have given a prescription for Restasis (cyclosporine) eyedrops.  Total time spent is 25 minutes with more than 50% of the time spent face-to-face discussing treatment plan, counseling and coordination of care.  Orders placed this encounter:  No orders of the defined types were placed in this encounter.     Derek Jack, MD [place] [phone]

## 2018-10-16 NOTE — Assessment & Plan Note (Signed)
1.  T3N0 right lung adenocarcinoma: - PET scan on 07/31/2018 shows 1.5 x 0.7 cm right upper lobe nodule, SUV 2.3.  Part solid 0.6 cm right upper lobe pulmonary nodule, SUV 1.2. -Status post resection on 08/23/2018 with pathology showing adenocarcinoma, 2 tumors, 1.3 cm and 0.7 cm.  Margins negative.  0/15 lymph nodes positive. -PDL 1 TPS 1%.  Foundation 1 shows TMB low, MS-stable.  No other targetable mutations. -She was recommended 4 cycles of adjuvant chemotherapy.  She is not a candidate for cisplatin. - Cycle 1 of carboplatin and pemetrexed on 09/26/2018. - She felt very well in the last few weeks.  I have reviewed her labs. -Creatinine is 1.33.  She will receive 100 mL of normal saline along with her chemo. -We will reevaluate her in 3 weeks.  She will proceed with cycle 2 tomorrow.  2.  Hyperkalemia/worsening renal function: - She will receive fluids prior to chemotherapy. -Potassium normalized after we discontinued potassium supplements.  3.  Dry eyes: -She used over-the-counter drops.  Still continues to have worsening dry eyes for the last 2 weeks.  She also reports that she had dry eyes for the last 20 years but not to this extent. -I have given a prescription for Restasis (cyclosporine) eyedrops.

## 2018-10-17 ENCOUNTER — Other Ambulatory Visit (HOSPITAL_COMMUNITY): Payer: Medicare Other

## 2018-10-17 ENCOUNTER — Inpatient Hospital Stay (HOSPITAL_COMMUNITY): Payer: Medicare Other

## 2018-10-17 ENCOUNTER — Ambulatory Visit (HOSPITAL_COMMUNITY): Payer: Medicare Other | Admitting: Hematology

## 2018-10-17 ENCOUNTER — Encounter (HOSPITAL_COMMUNITY): Payer: Self-pay

## 2018-10-17 VITALS — BP 118/58 | HR 56 | Temp 97.4°F | Resp 18

## 2018-10-17 DIAGNOSIS — C3491 Malignant neoplasm of unspecified part of right bronchus or lung: Secondary | ICD-10-CM

## 2018-10-17 DIAGNOSIS — Z5189 Encounter for other specified aftercare: Secondary | ICD-10-CM | POA: Diagnosis not present

## 2018-10-17 DIAGNOSIS — Z5111 Encounter for antineoplastic chemotherapy: Secondary | ICD-10-CM | POA: Diagnosis not present

## 2018-10-17 DIAGNOSIS — C3411 Malignant neoplasm of upper lobe, right bronchus or lung: Secondary | ICD-10-CM | POA: Diagnosis not present

## 2018-10-17 MED ORDER — PALONOSETRON HCL INJECTION 0.25 MG/5ML
0.2500 mg | Freq: Once | INTRAVENOUS | Status: AC
  Administered 2018-10-17: 12:00:00 0.25 mg via INTRAVENOUS
  Filled 2018-10-17: qty 5

## 2018-10-17 MED ORDER — SODIUM CHLORIDE 0.9 % IV SOLN
Freq: Once | INTRAVENOUS | Status: AC
  Administered 2018-10-17: 12:00:00 via INTRAVENOUS

## 2018-10-17 MED ORDER — SODIUM CHLORIDE 0.9 % IV SOLN
Freq: Once | INTRAVENOUS | Status: AC
  Administered 2018-10-17: 11:00:00 via INTRAVENOUS

## 2018-10-17 MED ORDER — PEGFILGRASTIM 6 MG/0.6ML ~~LOC~~ PSKT
6.0000 mg | PREFILLED_SYRINGE | Freq: Once | SUBCUTANEOUS | Status: AC
  Administered 2018-10-17: 6 mg via SUBCUTANEOUS
  Filled 2018-10-17: qty 0.6

## 2018-10-17 MED ORDER — SODIUM CHLORIDE 0.9 % IV SOLN
520.0000 mg/m2 | Freq: Once | INTRAVENOUS | Status: AC
  Administered 2018-10-17: 1000 mg via INTRAVENOUS
  Filled 2018-10-17: qty 40

## 2018-10-17 MED ORDER — SODIUM CHLORIDE 0.9 % IV SOLN
375.5000 mg | Freq: Once | INTRAVENOUS | Status: AC
  Administered 2018-10-17: 380 mg via INTRAVENOUS
  Filled 2018-10-17: qty 38

## 2018-10-17 MED ORDER — SODIUM CHLORIDE 0.9 % IV SOLN
Freq: Once | INTRAVENOUS | Status: AC
  Administered 2018-10-17: 12:00:00 via INTRAVENOUS
  Filled 2018-10-17: qty 5

## 2018-10-17 NOTE — Patient Instructions (Signed)
[facility] Discharge Instructions for Patients Receiving Chemotherapy  Today you received the following chemotherapy agents   To help prevent nausea and vomiting after your treatment, we encourage you to take your nausea medication   If you develop nausea and vomiting that is not controlled by your nausea medication, call the clinic.   BELOW ARE SYMPTOMS THAT SHOULD BE REPORTED IMMEDIATELY:  *FEVER GREATER THAN 100.5 F  *CHILLS WITH OR WITHOUT FEVER  NAUSEA AND VOMITING THAT IS NOT CONTROLLED WITH YOUR NAUSEA MEDICATION  *UNUSUAL SHORTNESS OF BREATH  *UNUSUAL BRUISING OR BLEEDING  TENDERNESS IN MOUTH AND THROAT WITH OR WITHOUT PRESENCE OF ULCERS  *URINARY PROBLEMS  *BOWEL PROBLEMS  UNUSUAL RASH Items with * indicate a potential emergency and should be followed up as soon as possible.  Feel free to call the clinic should you have any questions or concerns. The clinic phone number is [phone].  Please show the CHEMO ALERT CARD at check-in to the Emergency Department and triage nurse.   

## 2018-10-17 NOTE — Progress Notes (Signed)
Pt presents today for tx. VSS. Pt has no complaints of any changes since yesterday. MAR reviewed.   Treatment given today per MD orders. Tolerated infusion without adverse affects. Vital signs stable. No complaints at this time. Discharged from clinic ambulatory. F/U with [facility] as scheduled.

## 2018-10-23 ENCOUNTER — Other Ambulatory Visit (HOSPITAL_COMMUNITY): Payer: Medicare Other

## 2018-10-25 ENCOUNTER — Ambulatory Visit (INDEPENDENT_AMBULATORY_CARE_PROVIDER_SITE_OTHER): Payer: Medicare Other | Admitting: Cardiovascular Disease

## 2018-10-25 ENCOUNTER — Encounter: Payer: Self-pay | Admitting: Cardiovascular Disease

## 2018-10-25 ENCOUNTER — Other Ambulatory Visit: Payer: Self-pay

## 2018-10-25 VITALS — BP 114/43 | HR 50 | Temp 97.1°F | Ht 65.0 in | Wt 174.0 lb

## 2018-10-25 DIAGNOSIS — C3491 Malignant neoplasm of unspecified part of right bronchus or lung: Secondary | ICD-10-CM

## 2018-10-25 DIAGNOSIS — I480 Paroxysmal atrial fibrillation: Secondary | ICD-10-CM | POA: Diagnosis not present

## 2018-10-25 NOTE — Patient Instructions (Signed)
Medication Instructions:  STOP AMIODARONE STOP ELIQUIS   Labwork: NONE  Testing/Procedures: NONE  Follow-Up: Your physician wants you to follow-up in: 6 MONTHS .  You will receive a reminder letter in the mail two months in advance. If you don't receive a letter, please call our office to schedule the follow-up appointment.   Any Other Special Instructions Will Be Listed Below (If Applicable).  IF YOU START HAVING PALPITATIONS AGAIN- PLEASE CONTACT DR. Bronson Ing     If you need a refill on your cardiac medications before your next appointment, please call your pharmacy.

## 2018-10-25 NOTE — Progress Notes (Signed)
SUBJECTIVE: The patient presents for routine follow-up.  She has a history of right lung adenocarcinoma.    She was evaluated for new onset atrial fibrillation on 08/30/2018 by Dr. Stanford Breed.  This is my first time meeting her.  She converted to sinus rhythm with an IV amiodarone bolus on 08/29/2018.  It was felt that amiodarone would not be a great long-term option given her history of tobacco use and lobectomy.  Dr. Stanford Breed mentioned that this was likely postoperative atrial fibrillation with recommendation to continue amiodarone for 12 weeks and then discontinue if no recurrences.  Echocardiogram on 08/30/2018 demonstrated normal LV systolic function, EF 60 to 65%, with grade 2 diastolic dysfunction.  The patient denies any symptoms of chest pain, palpitations, shortness of breath, lightheadedness, dizziness, leg swelling, orthopnea, PND, and syncope.  She is eager to come off amiodarone and Eliquis.   Review of Systems: As per "subjective", otherwise negative.  No Known Allergies  Current Outpatient Medications  Medication Sig Dispense Refill  . amiodarone (PACERONE) 200 MG tablet Take 200 mg by mouth daily.    Marland Kitchen apixaban (ELIQUIS) 5 MG TABS tablet Take 1 tablet (5 mg total) by mouth 2 (two) times daily. 60 tablet 3  . Calcium Carb-Cholecalciferol (CALCIUM 600+D) 600-800 MG-UNIT TABS Take 1 tablet by mouth daily.    Marland Kitchen CARBOPLATIN IV Inject into the vein every 21 ( twenty-one) days.    . cycloSPORINE (RESTASIS) 0.05 % ophthalmic emulsion Place 1 drop into both eyes 2 (two) times daily. 0.4 mL 3  . ferrous sulfate 325 (65 FE) MG tablet Take 325 mg by mouth daily with breakfast.    . folic acid (FOLVITE) 1 MG tablet Take 1 tablet (1 mg total) by mouth daily. 30 tablet 5  . omeprazole (PRILOSEC) 40 MG capsule Take 40 mg by mouth daily.     Marland Kitchen PEMEtrexed 500 mg/m2 in sodium chloride 0.9 % 100 mL Inject 500 mg/m2 into the vein every 21 ( twenty-one) days.    . prochlorperazine  (COMPAZINE) 10 MG tablet Take 1 tablet (10 mg total) by mouth every 6 (six) hours as needed (Nausea or vomiting). 30 tablet 1  . simvastatin (ZOCOR) 20 MG tablet Take 20 mg by mouth daily at 6 PM.     . traMADol (ULTRAM) 50 MG tablet Take 1 tablet (50 mg total) by mouth every 6 (six) hours as needed (mild pain). 28 tablet 0   No current facility-administered medications for this visit.     Past Medical History:  Diagnosis Date  . Esophageal reflux   . Hyperlipidemia   . Osteoporosis     Past Surgical History:  Procedure Laterality Date  . COLONOSCOPY  09/27/2010   Procedure: COLONOSCOPY;  Surgeon: Jamesetta So;  [facility];  Service: Gastroenterology;  Laterality: N/A;  . NASAL SINUS SURGERY    . TUBAL LIGATION    . VIDEO ASSISTED THORACOSCOPY (VATS)/ LOBECTOMY Right 08/23/2018   Procedure: Right Video Assisted Thoracoscopy (Vats) with Right Upper Lobectomy and Node Dissection;  Surgeon: Melrose Nakayama, MD;  Location: [facility] OR;  Service: Thoracic;  Laterality: Right;    Social History   Socioeconomic History  . Marital status: Married    Spouse name: Aaron Edelman  . Number of children: 3  . Years of education: Not on file  . Highest education level: Not on file  Occupational History  . Occupation: retired  Scientific laboratory technician  . Financial resource strain: Not hard at all  .  Food insecurity    Worry: Never true    Inability: Never true  . Transportation needs    Medical: No    Non-medical: No  Tobacco Use  . Smoking status: Former Smoker    Packs/day: 0.50    Years: 55.00    Pack years: 27.50    Types: Cigarettes    Quit date: 08/16/2018    Years since quitting: 0.1  . Smokeless tobacco: Never Used  Substance and Sexual Activity  . Alcohol use: Yes    Alcohol/week: 3.0 standard drinks    Types: 3 Standard drinks or equivalent per week  . Drug use: Never  . Sexual activity: Not on file  Lifestyle  . Physical activity    Days per week: 0 days    Minutes per  session: 0 min  . Stress: Not at all  Relationships  . Social Herbalist on phone: Twice a week    Gets together: Once a week    Attends religious service: 1 to 4 times per year    Active member of club or organization: No    Attends meetings of clubs or organizations: Never    Relationship status: Married  . Intimate partner violence    Fear of current or ex partner: No    Emotionally abused: No    Physically abused: No    Forced sexual activity: No  Other Topics Concern  . Not on file  Social History Narrative  . Not on file     Vitals:   10/25/18 0831  BP: (!) 114/43  Pulse: (!) 50  Temp: (!) 97.1 F (36.2 C)  TempSrc: Temporal  SpO2: 99%  Weight: 174 lb (78.9 kg)  Height: 5\' 5"  (1.651 m)    Wt Readings from Last 3 Encounters:  10/25/18 174 lb (78.9 kg)  10/16/18 175 lb (79.4 kg)  10/03/18 172 lb 6.4 oz (78.2 kg)     PHYSICAL EXAM General: NAD HEENT: Normal. Neck: No JVD, no thyromegaly. Lungs: Clear to auscultation bilaterally with normal respiratory effort. CV: Regular rate and rhythm, normal S1/S2, no S3/S4, no murmur. No pretibial or periankle edema.  No carotid bruit.   Abdomen: Soft, nontender, no distention.  Neurologic: Alert and oriented.  Psych: Normal affect. Skin: Normal. Musculoskeletal: No gross deformities.      Labs: Lab Results  Component Value Date/Time   K 4.3 10/16/2018 09:01 AM   BUN 19 10/16/2018 09:01 AM   CREATININE 1.33 (H) 10/16/2018 09:01 AM   ALT 38 10/16/2018 09:01 AM   TSH 3.415 08/30/2018 11:13 AM   HGB 10.9 (L) 10/16/2018 09:01 AM     Lipids: No results found for: LDLCALC, LDLDIRECT, CHOL, TRIG, HDL     ASSESSMENT AND PLAN: 1.  Paroxysmal atrial fibrillation: Currently on amiodarone and apixaban.  It was felt that this represented postoperative atrial fibrillation with recommendation to continue amiodarone for a total of 12 weeks and then to discontinue if there were no recurrences.  She is eager to  come off of both amiodarone and Eliquis.  I will discontinue both.  I told her to inform me should she develop recurrence of palpitations.  2.  Right lung adenocarcinoma: Status post lobectomy.  Follows with oncology.   Disposition: Follow up 6 months   Kate Sable, M.D., F.A.C.C.

## 2018-10-30 ENCOUNTER — Ambulatory Visit (HOSPITAL_COMMUNITY): Payer: Medicare Other | Admitting: Hematology

## 2018-11-06 ENCOUNTER — Other Ambulatory Visit: Payer: Self-pay

## 2018-11-07 ENCOUNTER — Inpatient Hospital Stay (HOSPITAL_BASED_OUTPATIENT_CLINIC_OR_DEPARTMENT_OTHER): Payer: Medicare Other | Admitting: Hematology

## 2018-11-07 ENCOUNTER — Inpatient Hospital Stay (HOSPITAL_COMMUNITY): Payer: Medicare Other

## 2018-11-07 ENCOUNTER — Encounter (HOSPITAL_COMMUNITY): Payer: Self-pay | Admitting: Hematology

## 2018-11-07 VITALS — BP 132/40 | HR 57 | Temp 96.9°F | Resp 18

## 2018-11-07 DIAGNOSIS — C3491 Malignant neoplasm of unspecified part of right bronchus or lung: Secondary | ICD-10-CM

## 2018-11-07 DIAGNOSIS — Z5111 Encounter for antineoplastic chemotherapy: Secondary | ICD-10-CM | POA: Diagnosis not present

## 2018-11-07 DIAGNOSIS — C3411 Malignant neoplasm of upper lobe, right bronchus or lung: Secondary | ICD-10-CM | POA: Diagnosis not present

## 2018-11-07 DIAGNOSIS — Z5189 Encounter for other specified aftercare: Secondary | ICD-10-CM | POA: Diagnosis not present

## 2018-11-07 LAB — COMPREHENSIVE METABOLIC PANEL
ALT: 21 U/L (ref 0–44)
AST: 26 U/L (ref 15–41)
Albumin: 4 g/dL (ref 3.5–5.0)
Alkaline Phosphatase: 94 U/L (ref 38–126)
Anion gap: 9 (ref 5–15)
BUN: 17 mg/dL (ref 8–23)
CO2: 23 mmol/L (ref 22–32)
Calcium: 9.4 mg/dL (ref 8.9–10.3)
Chloride: 108 mmol/L (ref 98–111)
Creatinine, Ser: 1.26 mg/dL — ABNORMAL HIGH (ref 0.44–1.00)
GFR calc Af Amer: 51 mL/min — ABNORMAL LOW (ref >60)
GFR calc non Af Amer: 44 mL/min — ABNORMAL LOW (ref >60)
Glucose, Bld: 105 mg/dL — ABNORMAL HIGH (ref 70–99)
Potassium: 4.4 mmol/L (ref 3.5–5.1)
Sodium: 140 mmol/L (ref 135–145)
Total Bilirubin: 0.4 mg/dL (ref 0.3–1.2)
Total Protein: 7.1 g/dL (ref 6.5–8.1)

## 2018-11-07 LAB — CBC WITH DIFFERENTIAL/PLATELET
Abs Immature Granulocytes: 0.01 10*3/uL (ref 0.00–0.07)
Basophils Absolute: 0 10*3/uL (ref 0.0–0.1)
Basophils Relative: 1 %
Eosinophils Absolute: 0.1 10*3/uL (ref 0.0–0.5)
Eosinophils Relative: 2 %
HCT: 32.1 % — ABNORMAL LOW (ref 36.0–46.0)
Hemoglobin: 10.4 g/dL — ABNORMAL LOW (ref 12.0–15.0)
Immature Granulocytes: 0 %
Lymphocytes Relative: 32 %
Lymphs Abs: 1.1 10*3/uL (ref 0.7–4.0)
MCH: 32.5 pg (ref 26.0–34.0)
MCHC: 32.4 g/dL (ref 30.0–36.0)
MCV: 100.3 fL — ABNORMAL HIGH (ref 80.0–100.0)
Monocytes Absolute: 0.4 10*3/uL (ref 0.1–1.0)
Monocytes Relative: 13 %
Neutro Abs: 1.7 10*3/uL (ref 1.7–7.7)
Neutrophils Relative %: 52 %
Platelets: 220 10*3/uL (ref 150–400)
RBC: 3.2 MIL/uL — ABNORMAL LOW (ref 3.87–5.11)
RDW: 14.9 % (ref 11.5–15.5)
WBC: 3.3 10*3/uL — ABNORMAL LOW (ref 4.0–10.5)
nRBC: 0 % (ref 0.0–0.2)

## 2018-11-07 MED ORDER — PEGFILGRASTIM 6 MG/0.6ML ~~LOC~~ PSKT
6.0000 mg | PREFILLED_SYRINGE | Freq: Once | SUBCUTANEOUS | Status: AC
  Administered 2018-11-07: 14:00:00 6 mg via SUBCUTANEOUS
  Filled 2018-11-07: qty 0.6

## 2018-11-07 MED ORDER — PALONOSETRON HCL INJECTION 0.25 MG/5ML
0.2500 mg | Freq: Once | INTRAVENOUS | Status: AC
  Administered 2018-11-07: 0.25 mg via INTRAVENOUS
  Filled 2018-11-07: qty 5

## 2018-11-07 MED ORDER — CYANOCOBALAMIN 1000 MCG/ML IJ SOLN
1000.0000 ug | Freq: Once | INTRAMUSCULAR | Status: AC
  Administered 2018-11-07: 14:00:00 1000 ug via INTRAMUSCULAR
  Filled 2018-11-07: qty 1

## 2018-11-07 MED ORDER — SODIUM CHLORIDE 0.9% FLUSH
10.0000 mL | INTRAVENOUS | Status: DC | PRN
  Administered 2018-11-07: 12:00:00 10 mL
  Filled 2018-11-07 (×2): qty 10

## 2018-11-07 MED ORDER — SODIUM CHLORIDE 0.9 % IV SOLN
Freq: Once | INTRAVENOUS | Status: AC
  Administered 2018-11-07: 12:00:00 via INTRAVENOUS
  Filled 2018-11-07: qty 5

## 2018-11-07 MED ORDER — SODIUM CHLORIDE 0.9 % IV SOLN
389.5000 mg | Freq: Once | INTRAVENOUS | Status: AC
  Administered 2018-11-07: 390 mg via INTRAVENOUS
  Filled 2018-11-07: qty 39

## 2018-11-07 MED ORDER — SODIUM CHLORIDE 0.9 % IV SOLN
520.0000 mg/m2 | Freq: Once | INTRAVENOUS | Status: AC
  Administered 2018-11-07: 13:00:00 1000 mg via INTRAVENOUS
  Filled 2018-11-07: qty 40

## 2018-11-07 MED ORDER — HEPARIN SOD (PORK) LOCK FLUSH 100 UNIT/ML IV SOLN: 500.0000 [IU] | Freq: Once | INTRAVENOUS | Status: DC | PRN

## 2018-11-07 MED ORDER — SODIUM CHLORIDE 0.9 % IV SOLN
Freq: Once | INTRAVENOUS | Status: AC
  Administered 2018-11-07: 12:00:00 via INTRAVENOUS

## 2018-11-07 NOTE — Progress Notes (Signed)
To treatment room after oncology follow up for chemotherapy.  No s/s of distress noted.    Patient seen by Dr. Delton Coombes with lab review and ok to treat today verbal order.    Patient tolerated chemotherapy with no complaints voiced.  Peripheral IV site clean and dry with no bruising or swelling noted at site.  Good blood return noted before and after administration of chemotherapy.  Band aid applied.  Neulasta Onpro applied to abdomen with green indicator light flashing.  No alarms noted.  Patient left ambulatory with VSS and no s/s of distress noted.

## 2018-11-07 NOTE — Patient Instructions (Signed)
[place] at [facility] Discharge Instructions  You were seen today by Dr. Delton Coombes. He went over your recent lab results. He will see you back in 3 weeks for labs, treatment and follow up.   Thank you for choosing [place] at [facility] to provide your oncology and hematology care.  To afford each patient quality time with our provider, please arrive at least 15 minutes before your scheduled appointment time.   If you have a lab appointment with the [facility] please come in thru the  Main Entrance and check in at the main information desk  You need to re-schedule your appointment should you arrive 10 or more minutes late.  We strive to give you quality time with our providers, and arriving late affects you and other patients whose appointments are after yours.  Also, if you no show three or more times for appointments you may be dismissed from the clinic at the providers discretion.     Again, thank you for choosing [facility].  Our hope is that these requests will decrease the amount of time that you wait before being seen by our physicians.       _____________________________________________________________  Should you have questions after your visit to [facility], please contact our office at [phone] between the hours of 8:00 a.m. and 4:30 p.m.  Voicemails left after 4:00 p.m. will not be returned until the following business day.  For prescription refill requests, have your pharmacy contact our office and allow 72 hours.    Cancer Center Support Programs:   > Cancer Support Group  2nd Tuesday of the month 1pm-2pm, Journey Room

## 2018-11-08 NOTE — Progress Notes (Signed)
[address], [place] [address]   CLINIC:  Medical Oncology/Hematology  PCP:  Scherrie Bateman [place] [id number] [phone]   REASON FOR VISIT:  Cycle 2 of chemotherapy.   INTERVAL HISTORY:  Ms. Cochrane 68 y.o. female seen for follow-up of chemotherapy and toxicity assessment.  She received cycle 2 on 9 10/17/2018.  Denied any GI side effects including nausea, vomiting, diarrhea or constipation.  She is continuing folic acid tablet.  Appetite and energy levels are 100%.  Some shortness of breath on exertion present.  Dry eyes were better with cyclosporine eyedrops.  She is continuing to play bingo every week.   REVIEW OF SYSTEMS:  Review of Systems  Eyes: Positive for eye problems.  Respiratory: Positive for shortness of breath.   Psychiatric/Behavioral: Positive for sleep disturbance.  All other systems reviewed and are negative.    PAST MEDICAL/SURGICAL HISTORY:  Past Medical History:  Diagnosis Date  . Esophageal reflux   . Hyperlipidemia   . Osteoporosis    Past Surgical History:  Procedure Laterality Date  . COLONOSCOPY  09/27/2010   Procedure: COLONOSCOPY;  Surgeon: Jamesetta So;  [facility];  Service: Gastroenterology;  Laterality: N/A;  . NASAL SINUS SURGERY    . TUBAL LIGATION    . VIDEO ASSISTED THORACOSCOPY (VATS)/ LOBECTOMY Right 08/23/2018   Procedure: Right Video Assisted Thoracoscopy (Vats) with Right Upper Lobectomy and Node Dissection;  Surgeon: Melrose Nakayama, MD;  Location: [facility] OR;  Service: Thoracic;  Laterality: Right;     SOCIAL HISTORY:  Social History   Socioeconomic History  . Marital status: Married    Spouse name: Aaron Edelman  . Number of children: 3  . Years of education: Not on file  . Highest education level: Not on file  Occupational History  . Occupation: retired  Scientific laboratory technician  . Financial resource strain: Not hard at all  . Food insecurity     Worry: Never true    Inability: Never true  . Transportation needs    Medical: No    Non-medical: No  Tobacco Use  . Smoking status: Former Smoker    Packs/day: 0.50    Years: 55.00    Pack years: 27.50    Types: Cigarettes    Quit date: 08/16/2018    Years since quitting: 0.2  . Smokeless tobacco: Never Used  Substance and Sexual Activity  . Alcohol use: Yes    Alcohol/week: 3.0 standard drinks    Types: 3 Standard drinks or equivalent per week  . Drug use: Never  . Sexual activity: Not on file  Lifestyle  . Physical activity    Days per week: 0 days    Minutes per session: 0 min  . Stress: Not at all  Relationships  . Social Herbalist on phone: Twice a week    Gets together: Once a week    Attends religious service: 1 to 4 times per year    Active member of club or organization: No    Attends meetings of clubs or organizations: Never    Relationship status: Married  . Intimate partner violence    Fear of current or ex partner: No    Emotionally abused: No    Physically abused: No    Forced sexual activity: No  Other Topics Concern  . Not on file  Social History Narrative  . Not on file    FAMILY HISTORY:  Family History  Problem Relation Age of Onset  . Bone cancer Mother   . Alcohol abuse Father   . Melanoma Brother   . Breast cancer Maternal Grandmother     CURRENT MEDICATIONS:  Outpatient Encounter Medications as of 11/07/2018  Medication Sig  . Calcium Carb-Cholecalciferol (CALCIUM 600+D) 600-800 MG-UNIT TABS Take 1 tablet by mouth daily.  Marland Kitchen CARBOPLATIN IV Inject into the vein every 21 ( twenty-one) days.  . cycloSPORINE (RESTASIS) 0.05 % ophthalmic emulsion Place 1 drop into both eyes 2 (two) times daily.  . ferrous sulfate 325 (65 FE) MG tablet Take 325 mg by mouth daily with breakfast.  . folic acid (FOLVITE) 1 MG tablet Take 1 tablet (1 mg total) by mouth daily.  Marland Kitchen omeprazole (PRILOSEC) 40 MG capsule Take 40 mg by mouth daily.   Marland Kitchen  PEMEtrexed 500 mg/m2 in sodium chloride 0.9 % 100 mL Inject 500 mg/m2 into the vein every 21 ( twenty-one) days.  . simvastatin (ZOCOR) 20 MG tablet Take 20 mg by mouth daily at 6 PM.   . prochlorperazine (COMPAZINE) 10 MG tablet Take 1 tablet (10 mg total) by mouth every 6 (six) hours as needed (Nausea or vomiting). (Patient not taking: Reported on 11/07/2018)  . traMADol (ULTRAM) 50 MG tablet Take 1 tablet (50 mg total) by mouth every 6 (six) hours as needed (mild pain). (Patient not taking: Reported on 11/07/2018)   No facility-administered encounter medications on file as of 11/07/2018.     ALLERGIES:  No Known Allergies   PHYSICAL EXAM:  ECOG Performance status: 1  Vitals:   11/07/18 0959  BP: (!) 146/62  Pulse: 61  Resp: 18  Temp: 97.8 F (36.6 C)  SpO2: 98%   Filed Weights   11/07/18 0959  Weight: 175 lb 1.6 oz (79.4 kg)    Physical Exam Vitals signs reviewed.  Constitutional:      Appearance: Normal appearance.  Cardiovascular:     Rate and Rhythm: Normal rate and regular rhythm.     Heart sounds: Normal heart sounds.  Pulmonary:     Effort: Pulmonary effort is normal.     Breath sounds: Normal breath sounds.  Abdominal:     General: There is no distension.     Palpations: Abdomen is soft. There is no mass.  Musculoskeletal:        General: No swelling.  Lymphadenopathy:     Cervical: No cervical adenopathy.  Skin:    General: Skin is warm.  Neurological:     General: No focal deficit present.     Mental Status: She is alert and oriented to person, place, and time.  Psychiatric:        Mood and Affect: Mood normal.        Behavior: Behavior normal.      LABORATORY DATA:  I have reviewed the labs as listed.  CBC    Component Value Date/Time   WBC 3.3 (L) 11/07/2018 0851   RBC 3.20 (L) 11/07/2018 0851   HGB 10.4 (L) 11/07/2018 0851   HCT 32.1 (L) 11/07/2018 0851   PLT 220 11/07/2018 0851   MCV 100.3 (H) 11/07/2018 0851   MCH 32.5 11/07/2018  0851   MCHC 32.4 11/07/2018 0851   RDW 14.9 11/07/2018 0851   LYMPHSABS 1.1 11/07/2018 0851   MONOABS 0.4 11/07/2018 0851   EOSABS 0.1 11/07/2018 0851   BASOSABS 0.0 11/07/2018 0851   CMP Latest Ref Rng & Units 11/07/2018 10/16/2018 10/03/2018  Glucose 70 - 99  mg/dL 105(H) 111(H) 99  BUN 8 - 23 mg/dL 17 19 26(H)  Creatinine 0.44 - 1.00 mg/dL 1.26(H) 1.33(H) 1.47(H)  Sodium 135 - 145 mmol/L 140 140 137  Potassium 3.5 - 5.1 mmol/L 4.4 4.3 5.6(H)  Chloride 98 - 111 mmol/L 108 107 104  CO2 22 - 32 mmol/L '23 23 26  '$ Calcium 8.9 - 10.3 mg/dL 9.4 9.1 9.4  Total Protein 6.5 - 8.1 g/dL 7.1 7.2 7.2  Total Bilirubin 0.3 - 1.2 mg/dL 0.4 0.3 0.8  Alkaline Phos 38 - 126 U/L 94 88 87  AST 15 - 41 U/L 26 34 30  ALT 0 - 44 U/L 21 38 32       DIAGNOSTIC IMAGING:  I have independently reviewed the scans and discussed with the patient.   I have reviewed Venita Lick LPN's note and agree with the documentation.  I personally performed a face-to-face visit, made revisions and my assessment and plan is as follows.    ASSESSMENT & PLAN:   Adenocarcinoma of right lung ([place]) 1.  T3N0 right lung adenocarcinoma: -Status post resection on 08/23/2018 with pathology showing adenocarcinoma, 2 tumors, 1.3 cm and 0.7 cm.  Margins negative.  0/15 lymph nodes positive. -PD-L1 TPS 1%.  Foundation 1 shows TMB low, MS-stable.  No other targetable mutations. -2 cycles of carboplatin and pemetrexed on 09/26/2018 and 10/17/2018. -Denied any GI side effects.  She is continuing folic acid. -I have reviewed labs.  She will proceed with cycle 3 today.  We will see her back in 3 weeks for follow-up.  2.  CKD: -Creatinine today is 1.26.  This has improved from previous value.  3.  Dry eyes: -She used over-the-counter drops with no improvement.  She reports having dry eyes for the last 20 years. -She is currently using Restasis (cyclosporine) eyedrops.  These are helping better.  Total time spent is 25 minutes with  more than 50% of the time spent face-to-face discussing treatment plan, counseling and coordination of care.  Orders placed this encounter:  No orders of the defined types were placed in this encounter.     Derek Jack, MD [place] [phone]

## 2018-11-08 NOTE — Assessment & Plan Note (Signed)
1.  T3N0 right lung adenocarcinoma: -Status post resection on 08/23/2018 with pathology showing adenocarcinoma, 2 tumors, 1.3 cm and 0.7 cm.  Margins negative.  0/15 lymph nodes positive. -PD-L1 TPS 1%.  Foundation 1 shows TMB low, MS-stable.  No other targetable mutations. -2 cycles of carboplatin and pemetrexed on 09/26/2018 and 10/17/2018. -Denied any GI side effects.  She is continuing folic acid. -I have reviewed labs.  She will proceed with cycle 3 today.  We will see her back in 3 weeks for follow-up.  2.  CKD: -Creatinine today is 1.26.  This has improved from previous value.  3.  Dry eyes: -She used over-the-counter drops with no improvement.  She reports having dry eyes for the last 20 years. -She is currently using Restasis (cyclosporine) eyedrops.  These are helping better.

## 2018-11-11 ENCOUNTER — Other Ambulatory Visit: Payer: Self-pay | Admitting: Thoracic Surgery (Cardiothoracic Vascular Surgery)

## 2018-11-11 DIAGNOSIS — C3490 Malignant neoplasm of unspecified part of unspecified bronchus or lung: Secondary | ICD-10-CM

## 2018-11-12 ENCOUNTER — Ambulatory Visit (INDEPENDENT_AMBULATORY_CARE_PROVIDER_SITE_OTHER): Payer: Self-pay | Admitting: Thoracic Surgery (Cardiothoracic Vascular Surgery)

## 2018-11-12 ENCOUNTER — Encounter: Payer: Self-pay | Admitting: Thoracic Surgery (Cardiothoracic Vascular Surgery)

## 2018-11-12 ENCOUNTER — Other Ambulatory Visit: Payer: Self-pay

## 2018-11-12 ENCOUNTER — Ambulatory Visit
Admission: RE | Admit: 2018-11-12 | Discharge: 2018-11-12 | Disposition: A | Discharge status: Home / Self Care | Payer: Medicare Other | Source: Ambulatory Visit | Attending: Thoracic Surgery (Cardiothoracic Vascular Surgery) | Admitting: Thoracic Surgery (Cardiothoracic Vascular Surgery)

## 2018-11-12 VITALS — BP 140/60 | HR 88 | Temp 97.7°F | Resp 20 | Ht 65.0 in | Wt 175.0 lb

## 2018-11-12 DIAGNOSIS — C3491 Malignant neoplasm of unspecified part of right bronchus or lung: Secondary | ICD-10-CM

## 2018-11-12 DIAGNOSIS — Z902 Acquired absence of lung [part of]: Secondary | ICD-10-CM

## 2018-11-12 DIAGNOSIS — C3490 Malignant neoplasm of unspecified part of unspecified bronchus or lung: Secondary | ICD-10-CM

## 2018-11-12 DIAGNOSIS — Z09 Encounter for follow-up examination after completed treatment for conditions other than malignant neoplasm: Secondary | ICD-10-CM

## 2018-11-12 DIAGNOSIS — R0602 Shortness of breath: Secondary | ICD-10-CM | POA: Diagnosis not present

## 2018-11-12 NOTE — Progress Notes (Signed)
[address]       [place] [address]             [phone]     HPI: Mr. Keilman returns for scheduled follow-up visit  Rheya Minogue is a 67 year old woman with a past medical history significant for tobacco abuse, hyperlipidemia, reflux, osteoporosis, and thrombocytopenia.  She had a lung nodule noted on a low-dose screening CT.  On PET it was hypermetabolic.  I did a thoracoscopic right upper lobectomy on 08/23/2018.  Postoperatively she had atrial fibrillation and she was discharged on amiodarone and apixaban.  She had 2 tumor nodules so was a T3, N0, stage IIB.  She is currently undergoing adjuvant chemotherapy with carboplatin and pemetrexed.  She is tolerating that well so far.  She only has 1 more cycle to go.  She feels well.  She is does not have any pain related to her surgery.  Her respiratory status is stable. Past Medical History:  Diagnosis Date  . Esophageal reflux   . Hyperlipidemia   . Osteoporosis     Current Outpatient Medications  Medication Sig Dispense Refill  . Calcium Carb-Cholecalciferol (CALCIUM 600+D) 600-800 MG-UNIT TABS Take 1 tablet by mouth daily.    Marland Kitchen CARBOPLATIN IV Inject into the vein every 21 ( twenty-one) days.    . cycloSPORINE (RESTASIS) 0.05 % ophthalmic emulsion Place 1 drop into both eyes 2 (two) times daily. 0.4 mL 3  . ferrous sulfate 325 (65 FE) MG tablet Take 325 mg by mouth daily with breakfast.    . folic acid (FOLVITE) 1 MG tablet Take 1 tablet (1 mg total) by mouth daily. 30 tablet 5  . omeprazole (PRILOSEC) 40 MG capsule Take 40 mg by mouth daily.     Marland Kitchen PEMEtrexed 500 mg/m2 in sodium chloride 0.9 % 100 mL Inject 500 mg/m2 into the vein every 21 ( twenty-one) days.    . prochlorperazine (COMPAZINE) 10 MG tablet Take 1 tablet (10 mg total) by mouth every 6 (six) hours as needed (Nausea or vomiting). 30 tablet 1  . simvastatin (ZOCOR) 20 MG tablet Take 20 mg by mouth daily at 6 PM.     . traMADol (ULTRAM) 50  MG tablet Take 1 tablet (50 mg total) by mouth every 6 (six) hours as needed (mild pain). (Patient not taking: Reported on 11/12/2018) 28 tablet 0   No current facility-administered medications for this visit.     Physical Exam BP 140/60   Pulse 88   Temp 97.7 F (36.5 C) (Skin)   Resp 20   Ht 5\' 5"  (1.651 m)   Wt 175 lb (79.4 kg)   SpO2 93% Comment: RA  BMI 29.46 kg/m  68 year old woman in no acute distress Alert and oriented x3 with no focal deficits Incisions well-healed Lungs slightly diminished at right base, otherwise clear Cardiac regular rate and rhythm  Diagnostic Tests: CHEST - 2 VIEW  COMPARISON:  September 10, 2018  FINDINGS: There is scarring with volume loss the right. There is no frank edema or consolidation. No mass evident by radiography. Heart is upper normal in size with pulmonary vascularity normal. No adenopathy. No bone lesions.  IMPRESSION: Postoperative change with scarring on the right, stable. No edema or consolidation. No adenopathy evident. Stable cardiac silhouette.   Electronically Signed   By: Lowella Grip III M.D.   On: 11/12/2018 09:41 I personally reviewed the chest x-ray images and concur with the findings noted above  Impression:  Chika Cichowski is a 69 year old woman who had a thoracoscopic right upper lobectomy for a T3, N0, stage IIb adenocarcinoma in August 2020.  -Lung cancer-currently undergoing chemotherapy.  Has had 3 of 4 cycles.  Being followed by Dr. Delton Coombes.  -Postoperative atrial fibrillation-amiodarone and apixaban have been discontinued.  Plan: Follow-up with Dr. Delton Coombes. I will be happy to see Mrs. Sebree back at anytime in the future if I can be of any further assistance with her care  Melrose Nakayama, MD [facility] [phone]

## 2018-11-28 ENCOUNTER — Inpatient Hospital Stay (HOSPITAL_COMMUNITY): Payer: Medicare Other

## 2018-11-28 ENCOUNTER — Other Ambulatory Visit: Payer: Self-pay

## 2018-11-28 ENCOUNTER — Inpatient Hospital Stay (HOSPITAL_BASED_OUTPATIENT_CLINIC_OR_DEPARTMENT_OTHER): Payer: Medicare Other | Admitting: Hematology

## 2018-11-28 ENCOUNTER — Encounter (HOSPITAL_COMMUNITY): Payer: Self-pay | Admitting: Hematology

## 2018-11-28 ENCOUNTER — Inpatient Hospital Stay (HOSPITAL_COMMUNITY): Payer: Medicare Other | Attending: Hematology

## 2018-11-28 DIAGNOSIS — D709 Neutropenia, unspecified: Secondary | ICD-10-CM | POA: Diagnosis not present

## 2018-11-28 DIAGNOSIS — C3491 Malignant neoplasm of unspecified part of right bronchus or lung: Secondary | ICD-10-CM

## 2018-11-28 DIAGNOSIS — C3411 Malignant neoplasm of upper lobe, right bronchus or lung: Secondary | ICD-10-CM | POA: Insufficient documentation

## 2018-11-28 DIAGNOSIS — Z5189 Encounter for other specified aftercare: Secondary | ICD-10-CM | POA: Insufficient documentation

## 2018-11-28 DIAGNOSIS — D702 Other drug-induced agranulocytosis: Secondary | ICD-10-CM

## 2018-11-28 DIAGNOSIS — Z5111 Encounter for antineoplastic chemotherapy: Secondary | ICD-10-CM | POA: Diagnosis not present

## 2018-11-28 DIAGNOSIS — Z87891 Personal history of nicotine dependence: Secondary | ICD-10-CM | POA: Diagnosis not present

## 2018-11-28 LAB — CBC WITH DIFFERENTIAL/PLATELET
Abs Immature Granulocytes: 0.01 10*3/uL (ref 0.00–0.07)
Basophils Absolute: 0 10*3/uL (ref 0.0–0.1)
Basophils Relative: 1 %
Eosinophils Absolute: 0.1 10*3/uL (ref 0.0–0.5)
Eosinophils Relative: 3 %
HCT: 31 % — ABNORMAL LOW (ref 36.0–46.0)
Hemoglobin: 9.9 g/dL — ABNORMAL LOW (ref 12.0–15.0)
Immature Granulocytes: 1 %
Lymphocytes Relative: 38 %
Lymphs Abs: 0.8 10*3/uL (ref 0.7–4.0)
MCH: 33.1 pg (ref 26.0–34.0)
MCHC: 31.9 g/dL (ref 30.0–36.0)
MCV: 103.7 fL — ABNORMAL HIGH (ref 80.0–100.0)
Monocytes Absolute: 0.3 10*3/uL (ref 0.1–1.0)
Monocytes Relative: 13 %
Neutro Abs: 0.9 10*3/uL — ABNORMAL LOW (ref 1.7–7.7)
Neutrophils Relative %: 44 %
Platelets: 189 10*3/uL (ref 150–400)
RBC: 2.99 MIL/uL — ABNORMAL LOW (ref 3.87–5.11)
RDW: 17.3 % — ABNORMAL HIGH (ref 11.5–15.5)
WBC: 2 10*3/uL — ABNORMAL LOW (ref 4.0–10.5)
nRBC: 0 % (ref 0.0–0.2)

## 2018-11-28 LAB — COMPREHENSIVE METABOLIC PANEL
ALT: 36 U/L (ref 0–44)
AST: 36 U/L (ref 15–41)
Albumin: 3.9 g/dL (ref 3.5–5.0)
Alkaline Phosphatase: 121 U/L (ref 38–126)
Anion gap: 8 (ref 5–15)
BUN: 13 mg/dL (ref 8–23)
CO2: 24 mmol/L (ref 22–32)
Calcium: 9.5 mg/dL (ref 8.9–10.3)
Chloride: 109 mmol/L (ref 98–111)
Creatinine, Ser: 1.17 mg/dL — ABNORMAL HIGH (ref 0.44–1.00)
GFR calc Af Amer: 55 mL/min — ABNORMAL LOW (ref >60)
GFR calc non Af Amer: 48 mL/min — ABNORMAL LOW (ref >60)
Glucose, Bld: 101 mg/dL — ABNORMAL HIGH (ref 70–99)
Potassium: 3.8 mmol/L (ref 3.5–5.1)
Sodium: 141 mmol/L (ref 135–145)
Total Bilirubin: 0.6 mg/dL (ref 0.3–1.2)
Total Protein: 7.2 g/dL (ref 6.5–8.1)

## 2018-11-28 MED ORDER — FILGRASTIM-SNDZ 480 MCG/0.8ML IJ SOSY
480.0000 ug | PREFILLED_SYRINGE | Freq: Once | INTRAMUSCULAR | Status: AC
  Administered 2018-11-28: 480 ug via SUBCUTANEOUS
  Filled 2018-11-28: qty 0.8

## 2018-11-28 NOTE — Progress Notes (Signed)
11/28/18  Entered order for Zarxio 480 mcg SQ x 1  T.O. Dr Beckey Downing LPN/Kaelyn Innocent Ronnald Ramp, PharmD

## 2018-11-28 NOTE — Patient Instructions (Addendum)
[place] at [facility] Discharge Instructions  You were seen today by Dr. Delton Coombes. He went over your recent lab results. He will give NEupogen today and hold treatment. He will give you treatment tomorrow. He will see you back in 3 weeks for labs and follow up.   Thank you for choosing [place] at [facility] to provide your oncology and hematology care.  To afford each patient quality time with our provider, please arrive at least 15 minutes before your scheduled appointment time.   If you have a lab appointment with the [place] please come in thru the  Main Entrance and check in at the main information desk  You need to re-schedule your appointment should you arrive 10 or more minutes late.  We strive to give you quality time with our providers, and arriving late affects you and other patients whose appointments are after yours.  Also, if you no show three or more times for appointments you may be dismissed from the clinic at the providers discretion.     Again, thank you for choosing [facility].  Our hope is that these requests will decrease the amount of time that you wait before being seen by our physicians.       _____________________________________________________________  Should you have questions after your visit to [facility], please contact our office at [phone] between the hours of 8:00 a.m. and 4:30 p.m.  Voicemails left after 4:00 p.m. will not be returned until the following business day.  For prescription refill requests, have your pharmacy contact our office and allow 72 hours.    Cancer Center Support Programs:   > Cancer Support Group  2nd Tuesday of the month 1pm-2pm, Journey Room

## 2018-11-28 NOTE — Progress Notes (Signed)
Message received from [facility] LPN hold treatment today. ANC 0.9. Patient to return tomorrow for treatment and Zarxio 430mcg to be given today.   Cindy Butler presents today for injection per MD orders. Zarxio administered SQ in left Upper Arm. Administration without incident. Patient tolerated well.  No complaints at this time. Discharged from clinic ambulatory. F/U with [facility] as scheduled.

## 2018-11-28 NOTE — Progress Notes (Signed)
[place] [place], [place] [address]   CLINIC:  Medical Oncology/Hematology  PCP:  Scherrie Bateman [place] [address] [phone]   REASON FOR VISIT:  Cycle 2 of chemotherapy.   INTERVAL HISTORY:  [name] 68 y.o. female seen for follow-up of chemotherapy and toxicity assessment.  She is here for cycle 4.  She tolerated cycle 3 very well.  Appetite is 100%.  Energy levels are 75%.  Denies any fevers or night sweats.  No chills reported.  Denies any nausea, vomiting, diarrhea or constipation.   REVIEW OF SYSTEMS:  Review of Systems  Eyes: Positive for eye problems.  All other systems reviewed and are negative.    PAST MEDICAL/SURGICAL HISTORY:  Past Medical History:  Diagnosis Date  . Esophageal reflux   . Hyperlipidemia   . Osteoporosis    Past Surgical History:  Procedure Laterality Date  . COLONOSCOPY  09/27/2010   Procedure: COLONOSCOPY;  Surgeon: Jamesetta So;  [facility];  Service: Gastroenterology;  Laterality: N/A;  . NASAL SINUS SURGERY    . TUBAL LIGATION    . VIDEO ASSISTED THORACOSCOPY (VATS)/ LOBECTOMY Right 08/23/2018   Procedure: Right Video Assisted Thoracoscopy (Vats) with Right Upper Lobectomy and Node Dissection;  Surgeon: Melrose Nakayama, MD;  Location: [facility];  Service: Thoracic;  Laterality: Right;     SOCIAL HISTORY:  Social History   Socioeconomic History  . Marital status: Married    Spouse name: Aaron Edelman  . Number of children: 3  . Years of education: Not on file  . Highest education level: Not on file  Occupational History  . Occupation: retired  Scientific laboratory technician  . Financial resource strain: Not hard at all  . Food insecurity    Worry: Never true    Inability: Never true  . Transportation needs    Medical: No    Non-medical: No  Tobacco Use  . Smoking status: Former Smoker    Packs/day: 0.50    Years: 55.00    Pack years: 27.50    Types:  Cigarettes    Quit date: 08/16/2018    Years since quitting: 0.2  . Smokeless tobacco: Never Used  Substance and Sexual Activity  . Alcohol use: Yes    Alcohol/week: 3.0 standard drinks    Types: 3 Standard drinks or equivalent per week  . Drug use: Never  . Sexual activity: Not on file  Lifestyle  . Physical activity    Days per week: 0 days    Minutes per session: 0 min  . Stress: Not at all  Relationships  . Social Herbalist on phone: Twice a week    Gets together: Once a week    Attends religious service: 1 to 4 times per year    Active member of club or organization: No    Attends meetings of clubs or organizations: Never    Relationship status: Married  . Intimate partner violence    Fear of current or ex partner: No    Emotionally abused: No    Physically abused: No    Forced sexual activity: No  Other Topics Concern  . Not on file  Social History Narrative  . Not on file    FAMILY HISTORY:  Family History  Problem Relation Age of Onset  . Bone cancer Mother   . Alcohol abuse Father   . Melanoma Brother   . Breast cancer Maternal  Grandmother     CURRENT MEDICATIONS:  Outpatient Encounter Medications as of 11/28/2018  Medication Sig  . Calcium Carb-Cholecalciferol (CALCIUM 600+D) 600-800 MG-UNIT TABS Take 1 tablet by mouth daily.  Marland Kitchen CARBOPLATIN IV Inject into the vein every 21 ( twenty-one) days.  . cycloSPORINE (RESTASIS) 0.05 % ophthalmic emulsion Place 1 drop into both eyes 2 (two) times daily.  . ferrous sulfate 325 (65 FE) MG tablet Take 325 mg by mouth daily with breakfast.  . folic acid (FOLVITE) 1 MG tablet Take 1 tablet (1 mg total) by mouth daily.  Marland Kitchen omeprazole (PRILOSEC) 40 MG capsule Take 40 mg by mouth daily.   Marland Kitchen PEMEtrexed 500 mg/m2 in sodium chloride 0.9 % 100 mL Inject 500 mg/m2 into the vein every 21 ( twenty-one) days.  . simvastatin (ZOCOR) 20 MG tablet Take 20 mg by mouth daily at 6 PM.   . prochlorperazine (COMPAZINE) 10 MG  tablet Take 1 tablet (10 mg total) by mouth every 6 (six) hours as needed (Nausea or vomiting). (Patient not taking: Reported on 11/28/2018)  . traMADol (ULTRAM) 50 MG tablet Take 1 tablet (50 mg total) by mouth every 6 (six) hours as needed (mild pain). (Patient not taking: Reported on 11/12/2018)   No facility-administered encounter medications on file as of 11/28/2018.     ALLERGIES:  No Known Allergies   PHYSICAL EXAM:  ECOG Performance status: 1  Vitals:   11/28/18 1000  BP: (!) 143/56  Pulse: (!) 58  Resp: 18  Temp: 97.6 F (36.4 C)  SpO2: 97%   Filed Weights   11/28/18 1000  Weight: 173 lb (78.5 kg)    Physical Exam Vitals signs reviewed.  Constitutional:      Appearance: Normal appearance.  Cardiovascular:     Rate and Rhythm: Normal rate and regular rhythm.     Heart sounds: Normal heart sounds.  Pulmonary:     Effort: Pulmonary effort is normal.     Breath sounds: Normal breath sounds.  Abdominal:     General: There is no distension.     Palpations: Abdomen is soft. There is no mass.  Musculoskeletal:        General: No swelling.  Lymphadenopathy:     Cervical: No cervical adenopathy.  Skin:    General: Skin is warm.  Neurological:     General: No focal deficit present.     Mental Status: She is alert and oriented to person, place, and time.  Psychiatric:        Mood and Affect: Mood normal.        Behavior: Behavior normal.      LABORATORY DATA:  I have reviewed the labs as listed.  CBC    Component Value Date/Time   WBC 2.0 (L) 11/28/2018 0859   RBC 2.99 (L) 11/28/2018 0859   HGB 9.9 (L) 11/28/2018 0859   HCT 31.0 (L) 11/28/2018 0859   PLT 189 11/28/2018 0859   MCV 103.7 (H) 11/28/2018 0859   MCH 33.1 11/28/2018 0859   MCHC 31.9 11/28/2018 0859   RDW 17.3 (H) 11/28/2018 0859   LYMPHSABS 0.8 11/28/2018 0859   MONOABS 0.3 11/28/2018 0859   EOSABS 0.1 11/28/2018 0859   BASOSABS 0.0 11/28/2018 0859   CMP Latest Ref Rng & Units  11/28/2018 11/07/2018 10/16/2018  Glucose 70 - 99 mg/dL 101(H) 105(H) 111(H)  BUN 8 - 23 mg/dL '13 17 19  '$ Creatinine 0.44 - 1.00 mg/dL 1.17(H) 1.26(H) 1.33(H)  Sodium 135 - 145 mmol/L 141 140  140  Potassium 3.5 - 5.1 mmol/L 3.8 4.4 4.3  Chloride 98 - 111 mmol/L 109 108 107  CO2 22 - 32 mmol/L '24 23 23  '$ Calcium 8.9 - 10.3 mg/dL 9.5 9.4 9.1  Total Protein 6.5 - 8.1 g/dL 7.2 7.1 7.2  Total Bilirubin 0.3 - 1.2 mg/dL 0.6 0.4 0.3  Alkaline Phos 38 - 126 U/L 121 94 88  AST 15 - 41 U/L 36 26 34  ALT 0 - 44 U/L 36 21 38       DIAGNOSTIC IMAGING:  I have independently reviewed the scans and discussed with the patient.   I have reviewed Venita Lick LPN's note and agree with the documentation.  I personally performed a face-to-face visit, made revisions and my assessment and plan is as follows.    ASSESSMENT & PLAN:   Adenocarcinoma of right lung ([place]) 1.  T3N0 right lung adenocarcinoma: -Status post resection on 08/23/2018 with pathology showing adenocarcinoma, 2 tumors, 1.3 cm and 0.7 cm.  Margins negative.  0/15 lymph nodes positive. -PD-L1 TPS 1%.  Foundation 1 shows TMB low, MS-stable.  No other targetable mutations. -3 cycles of carboplatin and pemetrexed from 09/26/2018 through 11/07/2018. -She tolerated last cycle very well.  We reviewed blood work today.  ANC is low at 900. -We will hold her chemotherapy today.  We will give her Neupogen and recheck her CBC tomorrow.  2.  CKD: -Creatinine is 1.17 and improved from previous value.  3.  Dry eyes: -She used over-the-counter drops with no improvement.  She reports having dry eyes for the last 20 years. -She is currently using Restasis (cyclosporine) eyedrops.  These are helping better.   Orders placed this encounter:  No orders of the defined types were placed in this encounter.     Derek Jack, MD [place] [phone]

## 2018-11-28 NOTE — Patient Instructions (Signed)
[facility] at [facility]  Discharge Instructions:   _______________________________________________________________  Thank you for choosing [facility] at [facility] to provide your oncology and hematology care.  To afford each patient quality time with our providers, please arrive at least 15 minutes before your scheduled appointment.  You need to re-schedule your appointment if you arrive 10 or more minutes late.  We strive to give you quality time with our providers, and arriving late affects you and other patients whose appointments are after yours.  Also, if you no show three or more times for appointments you may be dismissed from the clinic.  Again, thank you for choosing  Cancer Center at [facility]. Our hope is that these requests will allow you access to exceptional care and in a timely manner. _______________________________________________________________  If you have questions after your visit, please contact our office at [phone] between the hours of 8:30 a.m. and 5:00 p.m. Voicemails left after 4:30 p.m. will not be returned until the following business day. _______________________________________________________________  For prescription refill requests, have your pharmacy contact our office. _______________________________________________________________  Recommendations made by the consultant and any test results will be sent to your referring physician. _______________________________________________________________ 

## 2018-11-28 NOTE — Assessment & Plan Note (Signed)
1.  T3N0 right lung adenocarcinoma: -Status post resection on 08/23/2018 with pathology showing adenocarcinoma, 2 tumors, 1.3 cm and 0.7 cm.  Margins negative.  0/15 lymph nodes positive. -PD-L1 TPS 1%.  Foundation 1 shows TMB low, MS-stable.  No other targetable mutations. -3 cycles of carboplatin and pemetrexed from 09/26/2018 through 11/07/2018. -She tolerated last cycle very well.  We reviewed blood work today.  ANC is low at 900. -We will hold her chemotherapy today.  We will give her Neupogen and recheck her CBC tomorrow.  2.  CKD: -Creatinine is 1.17 and improved from previous value.  3.  Dry eyes: -She used over-the-counter drops with no improvement.  She reports having dry eyes for the last 20 years. -She is currently using Restasis (cyclosporine) eyedrops.  These are helping better.

## 2018-11-29 ENCOUNTER — Inpatient Hospital Stay (HOSPITAL_COMMUNITY): Payer: Medicare Other

## 2018-11-29 ENCOUNTER — Encounter (HOSPITAL_COMMUNITY): Payer: Self-pay

## 2018-11-29 VITALS — BP 133/61 | HR 69 | Temp 98.1°F | Resp 18

## 2018-11-29 DIAGNOSIS — C3491 Malignant neoplasm of unspecified part of right bronchus or lung: Secondary | ICD-10-CM

## 2018-11-29 DIAGNOSIS — Z5111 Encounter for antineoplastic chemotherapy: Secondary | ICD-10-CM | POA: Diagnosis not present

## 2018-11-29 DIAGNOSIS — Z5189 Encounter for other specified aftercare: Secondary | ICD-10-CM | POA: Diagnosis not present

## 2018-11-29 DIAGNOSIS — D709 Neutropenia, unspecified: Secondary | ICD-10-CM | POA: Diagnosis not present

## 2018-11-29 DIAGNOSIS — C3411 Malignant neoplasm of upper lobe, right bronchus or lung: Secondary | ICD-10-CM | POA: Diagnosis not present

## 2018-11-29 DIAGNOSIS — Z87891 Personal history of nicotine dependence: Secondary | ICD-10-CM | POA: Diagnosis not present

## 2018-11-29 LAB — COMPREHENSIVE METABOLIC PANEL
ALT: 32 U/L (ref 0–44)
AST: 30 U/L (ref 15–41)
Albumin: 3.9 g/dL (ref 3.5–5.0)
Alkaline Phosphatase: 126 U/L (ref 38–126)
Anion gap: 8 (ref 5–15)
BUN: 17 mg/dL (ref 8–23)
CO2: 23 mmol/L (ref 22–32)
Calcium: 9.1 mg/dL (ref 8.9–10.3)
Chloride: 108 mmol/L (ref 98–111)
Creatinine, Ser: 1.25 mg/dL — ABNORMAL HIGH (ref 0.44–1.00)
GFR calc Af Amer: 51 mL/min — ABNORMAL LOW (ref >60)
GFR calc non Af Amer: 44 mL/min — ABNORMAL LOW (ref >60)
Glucose, Bld: 92 mg/dL (ref 70–99)
Potassium: 4.3 mmol/L (ref 3.5–5.1)
Sodium: 139 mmol/L (ref 135–145)
Total Bilirubin: 0.6 mg/dL (ref 0.3–1.2)
Total Protein: 7 g/dL (ref 6.5–8.1)

## 2018-11-29 LAB — CBC WITH DIFFERENTIAL/PLATELET
Abs Immature Granulocytes: 0.53 10*3/uL — ABNORMAL HIGH (ref 0.00–0.07)
Basophils Absolute: 0.1 10*3/uL (ref 0.0–0.1)
Basophils Relative: 0 %
Eosinophils Absolute: 0.1 10*3/uL (ref 0.0–0.5)
Eosinophils Relative: 0 %
HCT: 30.6 % — ABNORMAL LOW (ref 36.0–46.0)
Hemoglobin: 9.9 g/dL — ABNORMAL LOW (ref 12.0–15.0)
Immature Granulocytes: 3 %
Lymphocytes Relative: 8 %
Lymphs Abs: 1.4 10*3/uL (ref 0.7–4.0)
MCH: 33.7 pg (ref 26.0–34.0)
MCHC: 32.4 g/dL (ref 30.0–36.0)
MCV: 104.1 fL — ABNORMAL HIGH (ref 80.0–100.0)
Monocytes Absolute: 1 10*3/uL (ref 0.1–1.0)
Monocytes Relative: 6 %
Neutro Abs: 15.3 10*3/uL — ABNORMAL HIGH (ref 1.7–7.7)
Neutrophils Relative %: 83 %
Platelets: 225 10*3/uL (ref 150–400)
RBC: 2.94 MIL/uL — ABNORMAL LOW (ref 3.87–5.11)
RDW: 18.3 % — ABNORMAL HIGH (ref 11.5–15.5)
WBC: 18.4 10*3/uL — ABNORMAL HIGH (ref 4.0–10.5)
nRBC: 0 % (ref 0.0–0.2)

## 2018-11-29 MED ORDER — SODIUM CHLORIDE 0.9% FLUSH: 3.0000 mL | INTRAVENOUS | Status: DC | PRN

## 2018-11-29 MED ORDER — SODIUM CHLORIDE 0.9 % IV SOLN
520.0000 mg/m2 | Freq: Once | INTRAVENOUS | Status: AC
  Administered 2018-11-29: 1000 mg via INTRAVENOUS
  Filled 2018-11-29: qty 40

## 2018-11-29 MED ORDER — SODIUM CHLORIDE 0.9 % IV SOLN
Freq: Once | INTRAVENOUS | Status: AC
  Administered 2018-11-29: 09:00:00 via INTRAVENOUS

## 2018-11-29 MED ORDER — PEGFILGRASTIM 6 MG/0.6ML ~~LOC~~ PSKT
PREFILLED_SYRINGE | SUBCUTANEOUS | Status: AC
  Filled 2018-11-29: qty 0.6

## 2018-11-29 MED ORDER — PALONOSETRON HCL INJECTION 0.25 MG/5ML
0.2500 mg | Freq: Once | INTRAVENOUS | Status: AC
  Administered 2018-11-29: 0.25 mg via INTRAVENOUS

## 2018-11-29 MED ORDER — PALONOSETRON HCL INJECTION 0.25 MG/5ML
INTRAVENOUS | Status: AC
  Filled 2018-11-29: qty 5

## 2018-11-29 MED ORDER — SODIUM CHLORIDE 0.9 % IV SOLN
391.5000 mg | Freq: Once | INTRAVENOUS | Status: AC
  Administered 2018-11-29: 390 mg via INTRAVENOUS
  Filled 2018-11-29: qty 39

## 2018-11-29 MED ORDER — PEGFILGRASTIM 6 MG/0.6ML ~~LOC~~ PSKT
6.0000 mg | PREFILLED_SYRINGE | Freq: Once | SUBCUTANEOUS | Status: AC
  Administered 2018-11-29: 6 mg via SUBCUTANEOUS

## 2018-11-29 MED ORDER — SODIUM CHLORIDE 0.9 % IV SOLN
Freq: Once | INTRAVENOUS | Status: AC
  Administered 2018-11-29: 09:00:00 via INTRAVENOUS
  Filled 2018-11-29: qty 5

## 2018-11-29 NOTE — Progress Notes (Signed)
Cindy Butler tolerated Carboplatin and Alimta infusions well without complaints or incident. Labs reviewed prior to administering these medications. Peripheral IV site checked by 2 RN's with positive blood return noted prior to and after each infusion. Neulasta on-pro applied to pt's abdominal area with green indicator light flashing upon discharge. Pt discharged self ambulatory in satisfactory condition

## 2018-11-29 NOTE — Patient Instructions (Addendum)
[facility] Discharge Instructions for Patients Receiving Chemotherapy   Beginning January 23rd 2017 lab work for the [facility] will be done in the  Main lab at [facility] on 1st floor. If you have a lab appointment with the [place] please come in thru the  Main Entrance and check in at the main information desk   Today you received the following chemotherapy agents Carboplatin and Alimta as well as Neulasta on-pro. Follow-up as scheduled. Call clinic for any questions or concerns  To help prevent nausea and vomiting after your treatment, we encourage you to take your nausea medication   If you develop nausea and vomiting, or diarrhea that is not controlled by your medication, call the clinic.  The clinic phone number is [phone]. Office hours are Monday-Friday 8:30am-5:00pm.  BELOW ARE SYMPTOMS THAT SHOULD BE REPORTED IMMEDIATELY:  *FEVER GREATER THAN 101.0 F  *CHILLS WITH OR WITHOUT FEVER  NAUSEA AND VOMITING THAT IS NOT CONTROLLED WITH YOUR NAUSEA MEDICATION  *UNUSUAL SHORTNESS OF BREATH  *UNUSUAL BRUISING OR BLEEDING  TENDERNESS IN MOUTH AND THROAT WITH OR WITHOUT PRESENCE OF ULCERS  *URINARY PROBLEMS  *BOWEL PROBLEMS  UNUSUAL RASH Items with * indicate a potential emergency and should be followed up as soon as possible. If you have an emergency after office hours please contact your primary care physician or go to the nearest emergency department.  Please call the clinic during office hours if you have any questions or concerns.   You may also contact the Patient Navigator at [phone] should you have any questions or need assistance in obtaining follow up care.      Resources For Cancer Patients and their Caregivers ? American Cancer Society: Can assist with transportation, wigs, general needs, runs Look Good Feel Better.        717-827-4747 ? Cancer Care: Provides financial assistance, online support groups,  medication/co-pay assistance.  1-800-813-HOPE (828)872-2539) ? [place] Assists [place] cancer patients and their families through emotional , educational and financial support.  (787)006-9376 ? [place] DSS Where to apply for food stamps, Medicaid and utility assistance. 321-568-6832 ? [facility]: Transportation to medical appointments. (564) 546-0678 ? Social Security Administration: May apply for disability if have a Stage IV cancer. (301) 190-6703 936-214-2713 ? [employer], Disability and Transit Services: Assists with nutrition, care and transit needs. ([phone] 715-7498

## 2018-12-19 ENCOUNTER — Other Ambulatory Visit: Payer: Self-pay

## 2018-12-19 ENCOUNTER — Inpatient Hospital Stay (HOSPITAL_COMMUNITY): Payer: Medicare Other

## 2018-12-19 ENCOUNTER — Inpatient Hospital Stay (HOSPITAL_COMMUNITY): Payer: Medicare Other | Attending: Hematology | Admitting: Hematology

## 2018-12-19 ENCOUNTER — Encounter (HOSPITAL_COMMUNITY): Payer: Self-pay | Admitting: Hematology

## 2018-12-19 VITALS — BP 144/47 | HR 56 | Temp 97.5°F | Resp 18 | Wt 176.6 lb

## 2018-12-19 DIAGNOSIS — R0609 Other forms of dyspnea: Secondary | ICD-10-CM | POA: Diagnosis not present

## 2018-12-19 DIAGNOSIS — N189 Chronic kidney disease, unspecified: Secondary | ICD-10-CM | POA: Insufficient documentation

## 2018-12-19 DIAGNOSIS — R05 Cough: Secondary | ICD-10-CM | POA: Diagnosis not present

## 2018-12-19 DIAGNOSIS — Z87891 Personal history of nicotine dependence: Secondary | ICD-10-CM | POA: Diagnosis not present

## 2018-12-19 DIAGNOSIS — C3491 Malignant neoplasm of unspecified part of right bronchus or lung: Secondary | ICD-10-CM

## 2018-12-19 LAB — CBC WITH DIFFERENTIAL/PLATELET
Abs Immature Granulocytes: 0.01 10*3/uL (ref 0.00–0.07)
Basophils Absolute: 0 10*3/uL (ref 0.0–0.1)
Basophils Relative: 0 %
Eosinophils Absolute: 0 10*3/uL (ref 0.0–0.5)
Eosinophils Relative: 1 %
HCT: 27.3 % — ABNORMAL LOW (ref 36.0–46.0)
Hemoglobin: 8.8 g/dL — ABNORMAL LOW (ref 12.0–15.0)
Immature Granulocytes: 0 %
Lymphocytes Relative: 33 %
Lymphs Abs: 0.9 10*3/uL (ref 0.7–4.0)
MCH: 34.1 pg — ABNORMAL HIGH (ref 26.0–34.0)
MCHC: 32.2 g/dL (ref 30.0–36.0)
MCV: 105.8 fL — ABNORMAL HIGH (ref 80.0–100.0)
Monocytes Absolute: 0.4 10*3/uL (ref 0.1–1.0)
Monocytes Relative: 14 %
Neutro Abs: 1.5 10*3/uL — ABNORMAL LOW (ref 1.7–7.7)
Neutrophils Relative %: 52 %
Platelets: 151 10*3/uL (ref 150–400)
RBC: 2.58 MIL/uL — ABNORMAL LOW (ref 3.87–5.11)
RDW: 18.4 % — ABNORMAL HIGH (ref 11.5–15.5)
WBC: 2.9 10*3/uL — ABNORMAL LOW (ref 4.0–10.5)
nRBC: 0 % (ref 0.0–0.2)

## 2018-12-19 LAB — COMPREHENSIVE METABOLIC PANEL
ALT: 24 U/L (ref 0–44)
AST: 30 U/L (ref 15–41)
Albumin: 3.8 g/dL (ref 3.5–5.0)
Alkaline Phosphatase: 107 U/L (ref 38–126)
Anion gap: 11 (ref 5–15)
BUN: 19 mg/dL (ref 8–23)
CO2: 23 mmol/L (ref 22–32)
Calcium: 9.5 mg/dL (ref 8.9–10.3)
Chloride: 107 mmol/L (ref 98–111)
Creatinine, Ser: 1.14 mg/dL — ABNORMAL HIGH (ref 0.44–1.00)
GFR calc Af Amer: 57 mL/min — ABNORMAL LOW (ref >60)
GFR calc non Af Amer: 49 mL/min — ABNORMAL LOW (ref >60)
Glucose, Bld: 133 mg/dL — ABNORMAL HIGH (ref 70–99)
Potassium: 4.5 mmol/L (ref 3.5–5.1)
Sodium: 141 mmol/L (ref 135–145)
Total Bilirubin: 0.2 mg/dL — ABNORMAL LOW (ref 0.3–1.2)
Total Protein: 7.3 g/dL (ref 6.5–8.1)

## 2018-12-19 NOTE — Progress Notes (Signed)
[place] [place], [place] [address]   CLINIC:  Medical Oncology/Hematology  PCP:  Scherrie Bateman [place] [address] [phone]   REASON FOR VISIT:  Post chemo completion visit.   INTERVAL HISTORY:  Ms. Schexnayder 68 y.o. female seen for follow-up after completion of cycle 4.  She received cycle 4 on 11/29/2018.  Denied any nausea, vomiting, diarrhea or constipation.  She has dry cough which is stable.  Shortness of breath on exertion is also stable.  Appetite is 100%.  Energy levels are 50%.  Denies any GI symptoms.   REVIEW OF SYSTEMS:  Review of Systems  Respiratory: Positive for cough and shortness of breath.   All other systems reviewed and are negative.    PAST MEDICAL/SURGICAL HISTORY:  Past Medical History:  Diagnosis Date  . Esophageal reflux   . Hyperlipidemia   . Osteoporosis    Past Surgical History:  Procedure Laterality Date  . COLONOSCOPY  09/27/2010   Procedure: COLONOSCOPY;  Surgeon: Jamesetta So;  [facility];  Service: Gastroenterology;  Laterality: N/A;  . NASAL SINUS SURGERY    . TUBAL LIGATION    . VIDEO ASSISTED THORACOSCOPY (VATS)/ LOBECTOMY Right 08/23/2018   Procedure: Right Video Assisted Thoracoscopy (Vats) with Right Upper Lobectomy and Node Dissection;  Surgeon: Melrose Nakayama, MD;  Location: [facility] OR;  Service: Thoracic;  Laterality: Right;     SOCIAL HISTORY:  Social History   Socioeconomic History  . Marital status: Married    Spouse name: Aaron Edelman  . Number of children: 3  . Years of education: Not on file  . Highest education level: Not on file  Occupational History  . Occupation: retired  Tobacco Use  . Smoking status: Former Smoker    Packs/day: 0.50    Years: 55.00    Pack years: 27.50    Types: Cigarettes    Quit date: 08/16/2018    Years since quitting: 0.3  . Smokeless tobacco: Never Used  Substance and Sexual Activity  . Alcohol use:  Yes    Alcohol/week: 3.0 standard drinks    Types: 3 Standard drinks or equivalent per week  . Drug use: Never  . Sexual activity: Not on file  Other Topics Concern  . Not on file  Social History Narrative  . Not on file   Social Determinants of Health   Financial Resource Strain: Low Risk   . Difficulty of Paying Living Expenses: Not hard at all  Food Insecurity: No Food Insecurity  . Worried About Charity fundraiser in the Last Year: Never true  . Ran Out of Food in the Last Year: Never true  Transportation Needs: No Transportation Needs  . Lack of Transportation (Medical): No  . Lack of Transportation (Non-Medical): No  Physical Activity: Inactive  . Days of Exercise per Week: 0 days  . Minutes of Exercise per Session: 0 min  Stress: No Stress Concern Present  . Feeling of Stress : Not at all  Social Connections: Slightly Isolated  . Frequency of Communication with Friends and Family: Twice a week  . Frequency of Social Gatherings with Friends and Family: Once a week  . Attends Religious Services: 1 to 4 times per year  . Active Member of Clubs or Organizations: No  . Attends Archivist Meetings: Never  . Marital Status: Married  Human resources officer Violence: Not At Risk  . Fear of Current or Ex-Partner: No  .  Emotionally Abused: No  . Physically Abused: No  . Sexually Abused: No    FAMILY HISTORY:  Family History  Problem Relation Age of Onset  . Bone cancer Mother   . Alcohol abuse Father   . Melanoma Brother   . Breast cancer Maternal Grandmother     CURRENT MEDICATIONS:  Outpatient Encounter Medications as of 12/19/2018  Medication Sig  . Ascorbic Acid (VITAMIN C) 100 MG tablet Take 100 mg by mouth daily.  Marland Kitchen aspirin EC 81 MG tablet Take 81 mg by mouth daily.  . Cyanocobalamin (VITAMIN B 12 PO) Take 1,200 mg by mouth daily.  . Calcium Carb-Cholecalciferol (CALCIUM 600+D) 600-800 MG-UNIT TABS Take 1 tablet by mouth daily.  Marland Kitchen CARBOPLATIN IV Inject  into the vein every 21 ( twenty-one) days.  . cycloSPORINE (RESTASIS) 0.05 % ophthalmic emulsion Place 1 drop into both eyes 2 (two) times daily.  . ferrous sulfate 325 (65 FE) MG tablet Take 325 mg by mouth daily with breakfast.  . folic acid (FOLVITE) 1 MG tablet Take 1 tablet (1 mg total) by mouth daily.  Marland Kitchen omeprazole (PRILOSEC) 40 MG capsule Take 40 mg by mouth daily.   Marland Kitchen PEMEtrexed 500 mg/m2 in sodium chloride 0.9 % 100 mL Inject 500 mg/m2 into the vein every 21 ( twenty-one) days.  . prochlorperazine (COMPAZINE) 10 MG tablet Take 1 tablet (10 mg total) by mouth every 6 (six) hours as needed (Nausea or vomiting). (Patient not taking: Reported on 11/28/2018)  . simvastatin (ZOCOR) 20 MG tablet Take 20 mg by mouth daily at 6 PM.   . traMADol (ULTRAM) 50 MG tablet Take 1 tablet (50 mg total) by mouth every 6 (six) hours as needed (mild pain). (Patient not taking: Reported on 11/12/2018)   No facility-administered encounter medications on file as of 12/19/2018.    ALLERGIES:  No Known Allergies   PHYSICAL EXAM:  ECOG Performance status: 1  Vitals:   12/19/18 1015  BP: (!) 144/47  Pulse: (!) 56  Resp: 18  Temp: (!) 97.5 F (36.4 C)  SpO2: 100%   Filed Weights   12/19/18 1015  Weight: 176 lb 9.6 oz (80.1 kg)    Physical Exam Vitals reviewed.  Constitutional:      Appearance: Normal appearance.  Cardiovascular:     Rate and Rhythm: Normal rate and regular rhythm.     Heart sounds: Normal heart sounds.  Pulmonary:     Effort: Pulmonary effort is normal.     Breath sounds: Normal breath sounds.  Abdominal:     General: There is no distension.     Palpations: Abdomen is soft. There is no mass.  Musculoskeletal:        General: No swelling.  Lymphadenopathy:     Cervical: No cervical adenopathy.  Skin:    General: Skin is warm.  Neurological:     General: No focal deficit present.     Mental Status: She is alert and oriented to person, place, and time.  Psychiatric:         Mood and Affect: Mood normal.        Behavior: Behavior normal.      LABORATORY DATA:  I have reviewed the labs as listed.  CBC    Component Value Date/Time   WBC 2.9 (L) 12/19/2018 0906   RBC 2.58 (L) 12/19/2018 0906   HGB 8.8 (L) 12/19/2018 0906   HCT 27.3 (L) 12/19/2018 0906   PLT 151 12/19/2018 0906   MCV 105.8 (H)  12/19/2018 0906   MCH 34.1 (H) 12/19/2018 0906   MCHC 32.2 12/19/2018 0906   RDW 18.4 (H) 12/19/2018 0906   LYMPHSABS 0.9 12/19/2018 0906   MONOABS 0.4 12/19/2018 0906   EOSABS 0.0 12/19/2018 0906   BASOSABS 0.0 12/19/2018 0906   CMP Latest Ref Rng & Units 12/19/2018 11/29/2018 11/28/2018  Glucose 70 - 99 mg/dL 133(H) 92 101(H)  BUN 8 - 23 mg/dL _0 Creatinine 0.44 - 1.00 mg/dL 1.14(H) 1.25(H) 1.17(H)  Sodium 135 - 145 mmol/L 141 139 141  Potassium 3.5 - 5.1 mmol/L 4.5 4.3 3.8  Chloride 98 - 111 mmol/L 107 108 109  CO2 22 - 32 mmol/L _1 Calcium 8.9 - 10.3 mg/dL 9.5 9.1 9.5  Total Protein 6.5 - 8.1 g/dL 7.3 7.0 7.2  Total Bilirubin 0.3 - 1.2 mg/dL 0.2(L) 0.6 0.6  Alkaline Phos 38 - 126 U/L 107 126 121  AST 15 - 41 U/L 30 30 36  ALT 0 - 44 U/L 24 32 36       DIAGNOSTIC IMAGING:  I have independently reviewed the scans and discussed with the patient.   I have reviewed Venita Lick LPN's note and agree with the documentation.  I personally performed a face-to-face visit, made revisions and my assessment and plan is as follows.    ASSESSMENT & PLAN:   Adenocarcinoma of right lung ([place]) 1.  T3N0 right lung adenocarcinoma: -Status post resection on 08/23/2018 with pathology showing adenocarcinoma, 2 tumors, 1.3 cm and 0.7 cm.  Margins negative.  0/15 lymph nodes positive. -PD-L1 TPS 1%.  Foundation 1 shows TMB low, MS-stable.  No other targetable mutations. -4 cycles of carboplatin and pemetrexed from 09/26/2018 through 11/29/2018. -She tolerated her last cycle very well.  We reviewed her blood work.  White count is low at 2.9.  ANC  is 1500. -I will see her back in 6 weeks for follow-up.  I plan to repeat CT of the chest with contrast prior to next visit.   2.  CKD: -Creatinine is 1.14.  3.  Dry eyes: -She used over-the-counter drops with no improvement.  She reports having dry eyes for the last 20 years. -She is currently using Restasis (cyclosporine) eyedrops.  These are helping better.   Orders placed this encounter:  Orders Placed This Encounter  Procedures  . CT Chest W Contrast  . CBC with Differential/Platelet  . Comprehensive metabolic panel      Derek Jack, MD [place] [phone]

## 2018-12-19 NOTE — Assessment & Plan Note (Signed)
1.  T3N0 right lung adenocarcinoma: -Status post resection on 08/23/2018 with pathology showing adenocarcinoma, 2 tumors, 1.3 cm and 0.7 cm.  Margins negative.  0/15 lymph nodes positive. -PD-L1 TPS 1%.  Foundation 1 shows TMB low, MS-stable.  No other targetable mutations. -4 cycles of carboplatin and pemetrexed from 09/26/2018 through 11/29/2018. -She tolerated her last cycle very well.  We reviewed her blood work.  White count is low at 2.9.  ANC is 1500. -I will see her back in 6 weeks for follow-up.  I plan to repeat CT of the chest with contrast prior to next visit.   2.  CKD: -Creatinine is 1.14.  3.  Dry eyes: -She used over-the-counter drops with no improvement.  She reports having dry eyes for the last 20 years. -She is currently using Restasis (cyclosporine) eyedrops.  These are helping better. 

## 2018-12-19 NOTE — Patient Instructions (Addendum)
[place] at [facility] Discharge Instructions  You were seen today by Dr. Delton Coombes. He went over your recent lab results. He will see you back in 6 weeks for labs, CT chest and follow up.   Thank you for choosing [place] at [facility] to provide your oncology and hematology care.  To afford each patient quality time with our provider, please arrive at least 15 minutes before your scheduled appointment time.   If you have a lab appointment with the [facility] please come in thru the  Main Entrance and check in at the main information desk  You need to re-schedule your appointment should you arrive 10 or more minutes late.  We strive to give you quality time with our providers, and arriving late affects you and other patients whose appointments are after yours.  Also, if you no show three or more times for appointments you may be dismissed from the clinic at the providers discretion.     Again, thank you for choosing [facility].  Our hope is that these requests will decrease the amount of time that you wait before being seen by our physicians.       _____________________________________________________________  Should you have questions after your visit to [facility], please contact our office at [phone] between the hours of 8:00 a.m. and 4:30 p.m.  Voicemails left after 4:00 p.m. will not be returned until the following business day.  For prescription refill requests, have your pharmacy contact our office and allow 72 hours.    Cancer Center Support Programs:   > Cancer Support Group  2nd Tuesday of the month 1pm-2pm, Journey Room

## 2019-01-28 ENCOUNTER — Inpatient Hospital Stay (HOSPITAL_COMMUNITY): Payer: Medicare Other | Attending: Hematology

## 2019-01-28 ENCOUNTER — Ambulatory Visit (HOSPITAL_COMMUNITY)
Admission: RE | Admit: 2019-01-28 | Discharge: 2019-01-28 | Disposition: A | Discharge status: Home / Self Care | Payer: Medicare Other | Source: Ambulatory Visit | Attending: Hematology | Admitting: Hematology

## 2019-01-28 ENCOUNTER — Other Ambulatory Visit: Payer: Self-pay

## 2019-01-28 DIAGNOSIS — C3491 Malignant neoplasm of unspecified part of right bronchus or lung: Secondary | ICD-10-CM | POA: Diagnosis not present

## 2019-01-28 DIAGNOSIS — N189 Chronic kidney disease, unspecified: Secondary | ICD-10-CM | POA: Diagnosis not present

## 2019-01-28 DIAGNOSIS — J90 Pleural effusion, not elsewhere classified: Secondary | ICD-10-CM | POA: Diagnosis not present

## 2019-01-28 DIAGNOSIS — Z87891 Personal history of nicotine dependence: Secondary | ICD-10-CM | POA: Diagnosis not present

## 2019-01-28 LAB — CBC WITH DIFFERENTIAL/PLATELET
Abs Immature Granulocytes: 0.01 10*3/uL (ref 0.00–0.07)
Basophils Absolute: 0 10*3/uL (ref 0.0–0.1)
Basophils Relative: 1 %
Eosinophils Absolute: 0.1 10*3/uL (ref 0.0–0.5)
Eosinophils Relative: 4 %
HCT: 35.3 % — ABNORMAL LOW (ref 36.0–46.0)
Hemoglobin: 11.5 g/dL — ABNORMAL LOW (ref 12.0–15.0)
Immature Granulocytes: 0 %
Lymphocytes Relative: 36 %
Lymphs Abs: 0.9 10*3/uL (ref 0.7–4.0)
MCH: 34.5 pg — ABNORMAL HIGH (ref 26.0–34.0)
MCHC: 32.6 g/dL (ref 30.0–36.0)
MCV: 106 fL — ABNORMAL HIGH (ref 80.0–100.0)
Monocytes Absolute: 0.3 10*3/uL (ref 0.1–1.0)
Monocytes Relative: 12 %
Neutro Abs: 1.2 10*3/uL — ABNORMAL LOW (ref 1.7–7.7)
Neutrophils Relative %: 47 %
Platelets: 141 10*3/uL — ABNORMAL LOW (ref 150–400)
RBC: 3.33 MIL/uL — ABNORMAL LOW (ref 3.87–5.11)
RDW: 13.4 % (ref 11.5–15.5)
WBC: 2.6 10*3/uL — ABNORMAL LOW (ref 4.0–10.5)
nRBC: 0 % (ref 0.0–0.2)

## 2019-01-28 LAB — COMPREHENSIVE METABOLIC PANEL
ALT: 40 U/L (ref 0–44)
AST: 53 U/L — ABNORMAL HIGH (ref 15–41)
Albumin: 4.1 g/dL (ref 3.5–5.0)
Alkaline Phosphatase: 101 U/L (ref 38–126)
Anion gap: 9 (ref 5–15)
BUN: 16 mg/dL (ref 8–23)
CO2: 25 mmol/L (ref 22–32)
Calcium: 9.3 mg/dL (ref 8.9–10.3)
Chloride: 105 mmol/L (ref 98–111)
Creatinine, Ser: 1.05 mg/dL — ABNORMAL HIGH (ref 0.44–1.00)
GFR calc Af Amer: >60 mL/min (ref >60)
GFR calc non Af Amer: 54 mL/min — ABNORMAL LOW (ref >60)
Glucose, Bld: 110 mg/dL — ABNORMAL HIGH (ref 70–99)
Potassium: 4.2 mmol/L (ref 3.5–5.1)
Sodium: 139 mmol/L (ref 135–145)
Total Bilirubin: 0.6 mg/dL (ref 0.3–1.2)
Total Protein: 7.5 g/dL (ref 6.5–8.1)

## 2019-01-28 MED ORDER — IOHEXOL 300 MG/ML  SOLN
75.0000 mL | Freq: Once | INTRAMUSCULAR | Status: AC | PRN
  Administered 2019-01-28: 75 mL via INTRAVENOUS

## 2019-01-30 ENCOUNTER — Inpatient Hospital Stay (HOSPITAL_BASED_OUTPATIENT_CLINIC_OR_DEPARTMENT_OTHER): Payer: Medicare Other | Admitting: Hematology

## 2019-01-30 ENCOUNTER — Other Ambulatory Visit: Payer: Self-pay

## 2019-01-30 VITALS — BP 131/49 | HR 85 | Temp 97.2°F | Resp 18 | Wt 176.0 lb

## 2019-01-30 DIAGNOSIS — N189 Chronic kidney disease, unspecified: Secondary | ICD-10-CM | POA: Diagnosis not present

## 2019-01-30 DIAGNOSIS — Z87891 Personal history of nicotine dependence: Secondary | ICD-10-CM | POA: Diagnosis not present

## 2019-01-30 DIAGNOSIS — C3491 Malignant neoplasm of unspecified part of right bronchus or lung: Secondary | ICD-10-CM

## 2019-01-30 DIAGNOSIS — J90 Pleural effusion, not elsewhere classified: Secondary | ICD-10-CM | POA: Diagnosis not present

## 2019-01-30 NOTE — Progress Notes (Signed)
[address], [place] [address]   CLINIC:  Medical Oncology/Hematology  PCP:  Scherrie Bateman [place] [id number] [phone]   REASON FOR VISIT:  Post chemo completion visit.   INTERVAL HISTORY:  [name] 69 y.o. female seen for follow-up of lung cancer.  She is slowly feeling back to normal.  Appetite and energy levels are not present.  Denies any tingling or numbness next 2 days.  Denies any headaches or vision changes.  REVIEW OF SYSTEMS:  Review of Systems  All other systems reviewed and are negative.    PAST MEDICAL/SURGICAL HISTORY:  Past Medical History:  Diagnosis Date  . Esophageal reflux   . Hyperlipidemia   . Osteoporosis    Past Surgical History:  Procedure Laterality Date  . COLONOSCOPY  09/27/2010   Procedure: COLONOSCOPY;  Surgeon: Jamesetta So;  [facility];  Service: Gastroenterology;  Laterality: N/A;  . NASAL SINUS SURGERY    . TUBAL LIGATION    . VIDEO ASSISTED THORACOSCOPY (VATS)/ LOBECTOMY Right 08/23/2018   Procedure: Right Video Assisted Thoracoscopy (Vats) with Right Upper Lobectomy and Node Dissection;  Surgeon: Melrose Nakayama, MD;  Location: [facility] OR;  Service: Thoracic;  Laterality: Right;     SOCIAL HISTORY:  Social History   Socioeconomic History  . Marital status: Married    Spouse name: Aaron Edelman  . Number of children: 3  . Years of education: Not on file  . Highest education level: Not on file  Occupational History  . Occupation: retired  Tobacco Use  . Smoking status: Former Smoker    Packs/day: 0.50    Years: 55.00    Pack years: 27.50    Types: Cigarettes    Quit date: 08/16/2018    Years since quitting: 0.4  . Smokeless tobacco: Never Used  Substance and Sexual Activity  . Alcohol use: Yes    Alcohol/week: 3.0 standard drinks    Types: 3 Standard drinks or equivalent per week  . Drug use: Never  . Sexual activity: Not on file    Other Topics Concern  . Not on file  Social History Narrative  . Not on file   Social Determinants of Health   Financial Resource Strain: Low Risk   . Difficulty of Paying Living Expenses: Not hard at all  Food Insecurity: No Food Insecurity  . Worried About Charity fundraiser in the Last Year: Never true  . Ran Out of Food in the Last Year: Never true  Transportation Needs: No Transportation Needs  . Lack of Transportation (Medical): No  . Lack of Transportation (Non-Medical): No  Physical Activity: Inactive  . Days of Exercise per Week: 0 days  . Minutes of Exercise per Session: 0 min  Stress: No Stress Concern Present  . Feeling of Stress : Not at all  Social Connections: Slightly Isolated  . Frequency of Communication with Friends and Family: Twice a week  . Frequency of Social Gatherings with Friends and Family: Once a week  . Attends Religious Services: 1 to 4 times per year  . Active Member of Clubs or Organizations: No  . Attends Archivist Meetings: Never  . Marital Status: Married  Human resources officer Violence: Not At Risk  . Fear of Current or Ex-Partner: No  . Emotionally Abused: No  . Physically Abused: No  . Sexually Abused: No    FAMILY HISTORY:  Family History  Problem Relation Age  of Onset  . Bone cancer Mother   . Alcohol abuse Father   . Melanoma Brother   . Breast cancer Maternal Grandmother     CURRENT MEDICATIONS:  Outpatient Encounter Medications as of 01/30/2019  Medication Sig  . Ascorbic Acid (VITAMIN C) 100 MG tablet Take 100 mg by mouth daily.  Marland Kitchen aspirin EC 81 MG tablet Take 81 mg by mouth daily.  . Calcium Carb-Cholecalciferol (CALCIUM 600+D) 600-800 MG-UNIT TABS Take 1 tablet by mouth daily.  . Cyanocobalamin (VITAMIN B 12 PO) Take 1,200 mg by mouth daily.  . cycloSPORINE (RESTASIS) 0.05 % ophthalmic emulsion Place 1 drop into both eyes 2 (two) times daily.  . ferrous sulfate 325 (65 FE) MG tablet Take 325 mg by mouth daily  with breakfast.  . folic acid (FOLVITE) 1 MG tablet Take 1 tablet (1 mg total) by mouth daily.  Marland Kitchen omeprazole (PRILOSEC) 40 MG capsule Take 40 mg by mouth daily.   . simvastatin (ZOCOR) 20 MG tablet Take 20 mg by mouth daily at 6 PM.   . CARBOPLATIN IV Inject into the vein every 21 ( twenty-one) days.  Marland Kitchen PEMEtrexed 500 mg/m2 in sodium chloride 0.9 % 100 mL Inject 500 mg/m2 into the vein every 21 ( twenty-one) days.  . prochlorperazine (COMPAZINE) 10 MG tablet Take 1 tablet (10 mg total) by mouth every 6 (six) hours as needed (Nausea or vomiting). (Patient not taking: Reported on 01/30/2019)  . traMADol (ULTRAM) 50 MG tablet Take 1 tablet (50 mg total) by mouth every 6 (six) hours as needed (mild pain). (Patient not taking: Reported on 11/12/2018)   No facility-administered encounter medications on file as of 01/30/2019.    ALLERGIES:  No Known Allergies   PHYSICAL EXAM:  ECOG Performance status: 1  Vitals:   01/30/19 1118  BP: (!) 131/49  Pulse: 85  Resp: 18  Temp: (!) 97.2 F (36.2 C)  SpO2: (!) 6%   Filed Weights   01/30/19 1118  Weight: 176 lb (79.8 kg)    Physical Exam Vitals reviewed.  Constitutional:      Appearance: Normal appearance.  Cardiovascular:     Rate and Rhythm: Normal rate and regular rhythm.     Heart sounds: Normal heart sounds.  Pulmonary:     Effort: Pulmonary effort is normal.     Breath sounds: Normal breath sounds.  Abdominal:     General: There is no distension.     Palpations: Abdomen is soft. There is no mass.  Musculoskeletal:        General: No swelling.  Lymphadenopathy:     Cervical: No cervical adenopathy.  Skin:    General: Skin is warm.  Neurological:     General: No focal deficit present.     Mental Status: She is alert and oriented to person, place, and time.  Psychiatric:        Mood and Affect: Mood normal.        Behavior: Behavior normal.      LABORATORY DATA:  I have reviewed the labs as listed.  CBC    Component  Value Date/Time   WBC 2.6 (L) 01/28/2019 1208   RBC 3.33 (L) 01/28/2019 1208   HGB 11.5 (L) 01/28/2019 1208   HCT 35.3 (L) 01/28/2019 1208   PLT 141 (L) 01/28/2019 1208   MCV 106.0 (H) 01/28/2019 1208   MCH 34.5 (H) 01/28/2019 1208   MCHC 32.6 01/28/2019 1208   RDW 13.4 01/28/2019 1208   LYMPHSABS 0.9 01/28/2019  1208   MONOABS 0.3 01/28/2019 1208   EOSABS 0.1 01/28/2019 1208   BASOSABS 0.0 01/28/2019 1208   CMP Latest Ref Rng & Units 01/28/2019 12/19/2018 11/29/2018  Glucose 70 - 99 mg/dL 110(H) 133(H) 92  BUN 8 - 23 mg/dL '16 19 17  '$ Creatinine 0.44 - 1.00 mg/dL 1.05(H) 1.14(H) 1.25(H)  Sodium 135 - 145 mmol/L 139 141 139  Potassium 3.5 - 5.1 mmol/L 4.2 4.5 4.3  Chloride 98 - 111 mmol/L 105 107 108  CO2 22 - 32 mmol/L '25 23 23  '$ Calcium 8.9 - 10.3 mg/dL 9.3 9.5 9.1  Total Protein 6.5 - 8.1 g/dL 7.5 7.3 7.0  Total Bilirubin 0.3 - 1.2 mg/dL 0.6 0.2(L) 0.6  Alkaline Phos 38 - 126 U/L 101 107 126  AST 15 - 41 U/L 53(H) 30 30  ALT 0 - 44 U/L 40 24 32       DIAGNOSTIC IMAGING:  I have independently reviewed the scans and discussed with the patient.   I have reviewed Venita Lick LPN's note and agree with the documentation.  I personally performed a face-to-face visit, made revisions and my assessment and plan is as follows.    ASSESSMENT & PLAN:   Adenocarcinoma of right lung ([place]) 1.  T3N0 right lung adenocarcinoma: -Status post resection on 08/23/2018 with pathology showing adenocarcinoma, 2 tumors, 1.3 cm and 0.7 cm.  Margins negative.  0/15 lymph nodes positive. -PD-L1 TPS 1%.  Foundation 1 shows TMB low, MS-stable.  No other targetable mutations. -4 cycles of carboplatin and pemetrexed from 09/26/2018 through 11/29/2018. -We reviewed her labs.  White count is still low with ANC of 1200. -We reviewed CT chest from 01/28/2019.  Small right pleural effusion is stable.  No evidence of recurrence or metastatic disease. -We will see her back in 4 months with repeat  scan.   2.  CKD: -Creatinine is 1.14.  3.  Dry eyes: -She used over-the-counter drops with no improvement.  She reports having dry eyes for the last 20 years. -She is currently using Restasis (cyclosporine) eyedrops.  These are helping better.   Orders placed this encounter:  Orders Placed This Encounter  Procedures  . CT Chest W Contrast  . CBC with Differential/Platelet  . Comprehensive metabolic panel      Derek Jack, MD [place] [phone]

## 2019-01-30 NOTE — Patient Instructions (Signed)
[place] at [facility] Discharge Instructions  You were seen today by Dr. Delton Coombes. He went over your recent lab and scan results. He will repeat your scan prior to your next visit. He will see you back in 4 months for labs and follow up.   Thank you for choosing [place] at [facility] to provide your oncology and hematology care.  To afford each patient quality time with our provider, please arrive at least 15 minutes before your scheduled appointment time.   If you have a lab appointment with the [facility] please come in thru the  Main Entrance and check in at the main information desk  You need to re-schedule your appointment should you arrive 10 or more minutes late.  We strive to give you quality time with our providers, and arriving late affects you and other patients whose appointments are after yours.  Also, if you no show three or more times for appointments you may be dismissed from the clinic at the providers discretion.     Again, thank you for choosing [facility].  Our hope is that these requests will decrease the amount of time that you wait before being seen by our physicians.       _____________________________________________________________  Should you have questions after your visit to [facility], please contact our office at [phone] between the hours of 8:00 a.m. and 4:30 p.m.  Voicemails left after 4:00 p.m. will not be returned until the following business day.  For prescription refill requests, have your pharmacy contact our office and allow 72 hours.    Cancer Center Support Programs:   > Cancer Support Group  2nd Tuesday of the month 1pm-2pm, Journey Room

## 2019-02-02 NOTE — Assessment & Plan Note (Signed)
1.  T3N0 right lung adenocarcinoma: -Status post resection on 08/23/2018 with pathology showing adenocarcinoma, 2 tumors, 1.3 cm and 0.7 cm.  Margins negative.  0/15 lymph nodes positive. -PD-L1 TPS 1%.  Foundation 1 shows TMB low, MS-stable.  No other targetable mutations. -4 cycles of carboplatin and pemetrexed from 09/26/2018 through 11/29/2018. -We reviewed her labs.  White count is still low with ANC of 1200. -We reviewed CT chest from 01/28/2019.  Small right pleural effusion is stable.  No evidence of recurrence or metastatic disease. -We will see her back in 4 months with repeat scan.   2.  CKD: -Creatinine is 1.14.  3.  Dry eyes: -She used over-the-counter drops with no improvement.  She reports having dry eyes for the last 20 years. -She is currently using Restasis (cyclosporine) eyedrops.  These are helping better.

## 2019-02-19 DIAGNOSIS — H1045 Other chronic allergic conjunctivitis: Secondary | ICD-10-CM | POA: Diagnosis not present

## 2019-02-19 DIAGNOSIS — H2513 Age-related nuclear cataract, bilateral: Secondary | ICD-10-CM | POA: Diagnosis not present

## 2019-03-20 DIAGNOSIS — H2513 Age-related nuclear cataract, bilateral: Secondary | ICD-10-CM | POA: Diagnosis not present

## 2019-03-20 DIAGNOSIS — H1045 Other chronic allergic conjunctivitis: Secondary | ICD-10-CM | POA: Diagnosis not present

## 2019-04-09 DIAGNOSIS — J449 Chronic obstructive pulmonary disease, unspecified: Secondary | ICD-10-CM | POA: Diagnosis not present

## 2019-04-09 DIAGNOSIS — M810 Age-related osteoporosis without current pathological fracture: Secondary | ICD-10-CM | POA: Diagnosis not present

## 2019-04-09 DIAGNOSIS — Z720 Tobacco use: Secondary | ICD-10-CM | POA: Diagnosis not present

## 2019-04-09 DIAGNOSIS — E7849 Other hyperlipidemia: Secondary | ICD-10-CM | POA: Diagnosis not present

## 2019-04-18 ENCOUNTER — Other Ambulatory Visit (HOSPITAL_COMMUNITY): Payer: Self-pay | Admitting: Family Medicine

## 2019-04-18 DIAGNOSIS — Z1231 Encounter for screening mammogram for malignant neoplasm of breast: Secondary | ICD-10-CM

## 2019-04-23 ENCOUNTER — Ambulatory Visit (INDEPENDENT_AMBULATORY_CARE_PROVIDER_SITE_OTHER): Payer: Medicare Other | Admitting: Cardiovascular Disease

## 2019-04-23 ENCOUNTER — Other Ambulatory Visit: Payer: Self-pay

## 2019-04-23 ENCOUNTER — Encounter: Payer: Self-pay | Admitting: Cardiovascular Disease

## 2019-04-23 VITALS — BP 136/76 | HR 57 | Temp 97.4°F | Ht 65.0 in | Wt 173.0 lb

## 2019-04-23 DIAGNOSIS — I480 Paroxysmal atrial fibrillation: Secondary | ICD-10-CM

## 2019-04-23 DIAGNOSIS — C3491 Malignant neoplasm of unspecified part of right bronchus or lung: Secondary | ICD-10-CM

## 2019-04-23 NOTE — Progress Notes (Signed)
SUBJECTIVE: The patient presents for follow-up of paroxysmal atrial fibrillation.  It was felt to be postoperative in nature.  She also has a history of right lung adenocarcinoma and lobectomy.  Echocardiogram on 08/30/2018 demonstrated normal LV systolic function, EF 60 to 65%, with grade 2 diastolic dysfunction.  The patient denies any symptoms of chest pain, palpitations, shortness of breath, lightheadedness, dizziness, leg swelling, orthopnea, PND, and syncope.  She told me her husband wants a divorce after 20 years of marriage.  They have been together for 32 years.  She recently bought a Air cabin crew and plans to drive out [place] for a long, extended trip.  She is thinking about visiting [place].    Review of Systems: As per "subjective", otherwise negative.  No Known Allergies  Current Outpatient Medications  Medication Sig Dispense Refill  . Ascorbic Acid (VITAMIN C) 100 MG tablet Take 100 mg by mouth daily.    Marland Kitchen aspirin EC 81 MG tablet Take 81 mg by mouth daily.    . Calcium Carb-Cholecalciferol (CALCIUM 600+D) 600-800 MG-UNIT TABS Take 1 tablet by mouth daily.    . Cyanocobalamin (VITAMIN B 12 PO) Take 1,200 mg by mouth daily.    . cycloSPORINE (RESTASIS) 0.05 % ophthalmic emulsion Place 1 drop into both eyes 2 (two) times daily. 0.4 mL 3  . ferrous sulfate 325 (65 FE) MG tablet Take 325 mg by mouth daily with breakfast.    . omeprazole (PRILOSEC) 40 MG capsule Take 40 mg by mouth daily.     . simvastatin (ZOCOR) 20 MG tablet Take 20 mg by mouth daily at 6 PM.      No current facility-administered medications for this visit.    Past Medical History:  Diagnosis Date  . Esophageal reflux   . Hyperlipidemia   . Osteoporosis     Past Surgical History:  Procedure Laterality Date  . COLONOSCOPY  09/27/2010   Procedure: COLONOSCOPY;  Surgeon: Jamesetta So;  [facility];  Service: Gastroenterology;  Laterality: N/A;  . NASAL SINUS SURGERY    . TUBAL  LIGATION    . VIDEO ASSISTED THORACOSCOPY (VATS)/ LOBECTOMY Right 08/23/2018   Procedure: Right Video Assisted Thoracoscopy (Vats) with Right Upper Lobectomy and Node Dissection;  Surgeon: Melrose Nakayama, MD;  Location: [facility] OR;  Service: Thoracic;  Laterality: Right;    Social History   Socioeconomic History  . Marital status: Married    Spouse name: Aaron Edelman  . Number of children: 3  . Years of education: Not on file  . Highest education level: Not on file  Occupational History  . Occupation: retired  Tobacco Use  . Smoking status: Former Smoker    Packs/day: 0.50    Years: 55.00    Pack years: 27.50    Types: Cigarettes    Quit date: 08/16/2018    Years since quitting: 0.6  . Smokeless tobacco: Never Used  Substance and Sexual Activity  . Alcohol use: Yes    Alcohol/week: 3.0 standard drinks    Types: 3 Standard drinks or equivalent per week  . Drug use: Never  . Sexual activity: Not on file  Other Topics Concern  . Not on file  Social History Narrative  . Not on file   Social Determinants of Health   Financial Resource Strain: Low Risk   . Difficulty of Paying Living Expenses: Not hard at all  Food Insecurity: No Food Insecurity  . Worried About Charity fundraiser in  the Last Year: Never true  . Ran Out of Food in the Last Year: Never true  Transportation Needs: No Transportation Needs  . Lack of Transportation (Medical): No  . Lack of Transportation (Non-Medical): No  Physical Activity: Inactive  . Days of Exercise per Week: 0 days  . Minutes of Exercise per Session: 0 min  Stress: No Stress Concern Present  . Feeling of Stress : Not at all  Social Connections: Slightly Isolated  . Frequency of Communication with Friends and Family: Twice a week  . Frequency of Social Gatherings with Friends and Family: Once a week  . Attends Religious Services: 1 to 4 times per year  . Active Member of Clubs or Organizations: No  . Attends Archivist Meetings:  Never  . Marital Status: Married  Human resources officer Violence: Not At Risk  . Fear of Current or Ex-Partner: No  . Emotionally Abused: No  . Physically Abused: No  . Sexually Abused: No    Barbarann Ehlers, RN was present throughout the entirety of the encounter.  Vitals:   04/23/19 0855  BP: 136/76  Pulse: (!) 57  Temp: (!) 97.4 F (36.3 C)  SpO2: 99%  Height: 5\' 5"  (1.651 m)    Wt Readings from Last 3 Encounters:  01/30/19 176 lb (79.8 kg)  12/19/18 176 lb 9.6 oz (80.1 kg)  11/28/18 173 lb (78.5 kg)     PHYSICAL EXAM General: NAD HEENT: Normal. Neck: No JVD, no thyromegaly. Lungs: Clear to auscultation bilaterally with normal respiratory effort. CV: Regular rate and rhythm, normal S1/S2, no S3/S4, no murmur. No pretibial or periankle edema.  No carotid bruit.   Abdomen: Soft, nontender, no distention.  Neurologic: Alert and oriented.  Psych: Normal affect. Skin: Normal. Musculoskeletal: No gross deformities.      Labs: Lab Results  Component Value Date/Time   K 4.2 01/28/2019 12:08 PM   BUN 16 01/28/2019 12:08 PM   CREATININE 1.05 (H) 01/28/2019 12:08 PM   ALT 40 01/28/2019 12:08 PM   TSH 3.415 08/30/2018 11:13 AM   HGB 11.5 (L) 01/28/2019 12:08 PM     Lipids: No results found for: LDLCALC, LDLDIRECT, CHOL, TRIG, HDL     ASSESSMENT AND PLAN:  1.  Paroxysmal atrial fibrillation: It was felt that this was postoperative in nature.  She denies palpitations.  She had been on amiodarone for [redacted] weeks along with apixaban.  She is no longer on either of these medications as I discontinued them on 10/25/2018.  She is on aspirin.  She has baseline bradycardia without AV nodal blocking agents.  2.  Right lung adenocarcinoma: Status post lobectomy.  She follows with oncology.   Disposition: Follow up 6 months   Kate Sable, M.D., F.A.C.C.

## 2019-04-23 NOTE — Patient Instructions (Signed)
Medication Instructions:  Your physician recommends that you continue on your current medications as directed. Please refer to the Current Medication list given to you today.  *If you need a refill on your cardiac medications before your next appointment, please call your pharmacy*   Lab Work: None today If you have labs (blood work) drawn today and your tests are completely normal, you will receive your results only by: Marland Kitchen MyChart Message (if you have MyChart) OR . A paper copy in the mail If you have any lab test that is abnormal or we need to change your treatment, we will call you to review the results.   Testing/Procedures: None today   Follow-Up: At [facility], you and your health needs are our priority.  As part of our continuing mission to provide you with exceptional heart care, we have created designated Provider Care Teams.  These Care Teams include your primary Cardiologist (physician) and Advanced Practice Providers (APPs -  Physician Assistants and Nurse Practitioners) who all work together to provide you with the care you need, when you need it.  We recommend signing up for the patient portal called "MyChart".  Sign up information is provided on this After Visit Summary.  MyChart is used to connect with patients for Virtual Visits (Telemedicine).  Patients are able to view lab/test results, encounter notes, upcoming appointments, etc.  Non-urgent messages can be sent to your provider as well.   To learn more about what you can do with MyChart, go to [email or web address].    Your next appointment:   6 month(s)  The format for your next appointment:   In Person  Provider:   Kate Sable, MD   Other Instructions None      Thank you for choosing [place] !

## 2019-06-02 ENCOUNTER — Other Ambulatory Visit: Payer: Self-pay

## 2019-06-02 ENCOUNTER — Ambulatory Visit (HOSPITAL_COMMUNITY)
Admission: RE | Admit: 2019-06-02 | Discharge: 2019-06-02 | Disposition: A | Discharge status: Home / Self Care | Payer: Medicare Other | Source: Ambulatory Visit | Attending: Family Medicine | Admitting: Family Medicine

## 2019-06-02 ENCOUNTER — Inpatient Hospital Stay (HOSPITAL_COMMUNITY): Payer: Medicare Other | Attending: Hematology

## 2019-06-02 ENCOUNTER — Ambulatory Visit (HOSPITAL_COMMUNITY)
Admission: RE | Admit: 2019-06-02 | Discharge: 2019-06-02 | Disposition: A | Discharge status: Home / Self Care | Payer: Medicare Other | Source: Ambulatory Visit | Attending: Hematology | Admitting: Hematology

## 2019-06-02 DIAGNOSIS — Z87891 Personal history of nicotine dependence: Secondary | ICD-10-CM | POA: Insufficient documentation

## 2019-06-02 DIAGNOSIS — C3491 Malignant neoplasm of unspecified part of right bronchus or lung: Secondary | ICD-10-CM | POA: Insufficient documentation

## 2019-06-02 DIAGNOSIS — N189 Chronic kidney disease, unspecified: Secondary | ICD-10-CM | POA: Insufficient documentation

## 2019-06-02 DIAGNOSIS — Z902 Acquired absence of lung [part of]: Secondary | ICD-10-CM | POA: Insufficient documentation

## 2019-06-02 DIAGNOSIS — Z1231 Encounter for screening mammogram for malignant neoplasm of breast: Secondary | ICD-10-CM | POA: Diagnosis not present

## 2019-06-02 LAB — COMPREHENSIVE METABOLIC PANEL
ALT: 19 U/L (ref 0–44)
AST: 27 U/L (ref 15–41)
Albumin: 4 g/dL (ref 3.5–5.0)
Alkaline Phosphatase: 88 U/L (ref 38–126)
Anion gap: 8 (ref 5–15)
BUN: 14 mg/dL (ref 8–23)
CO2: 27 mmol/L (ref 22–32)
Calcium: 9.8 mg/dL (ref 8.9–10.3)
Chloride: 103 mmol/L (ref 98–111)
Creatinine, Ser: 0.98 mg/dL (ref 0.44–1.00)
GFR calc Af Amer: >60 mL/min (ref >60)
GFR calc non Af Amer: 59 mL/min — ABNORMAL LOW (ref >60)
Glucose, Bld: 112 mg/dL — ABNORMAL HIGH (ref 70–99)
Potassium: 4 mmol/L (ref 3.5–5.1)
Sodium: 138 mmol/L (ref 135–145)
Total Bilirubin: 0.5 mg/dL (ref 0.3–1.2)
Total Protein: 7.6 g/dL (ref 6.5–8.1)

## 2019-06-02 LAB — CBC WITH DIFFERENTIAL/PLATELET
Abs Immature Granulocytes: 0 10*3/uL (ref 0.00–0.07)
Basophils Absolute: 0 10*3/uL (ref 0.0–0.1)
Basophils Relative: 0 %
Eosinophils Absolute: 0 10*3/uL (ref 0.0–0.5)
Eosinophils Relative: 2 %
HCT: 37.4 % (ref 36.0–46.0)
Hemoglobin: 12.1 g/dL (ref 12.0–15.0)
Immature Granulocytes: 0 %
Lymphocytes Relative: 28 %
Lymphs Abs: 0.8 10*3/uL (ref 0.7–4.0)
MCH: 32 pg (ref 26.0–34.0)
MCHC: 32.4 g/dL (ref 30.0–36.0)
MCV: 98.9 fL (ref 80.0–100.0)
Monocytes Absolute: 0.3 10*3/uL (ref 0.1–1.0)
Monocytes Relative: 11 %
Neutro Abs: 1.6 10*3/uL — ABNORMAL LOW (ref 1.7–7.7)
Neutrophils Relative %: 59 %
Platelets: 169 10*3/uL (ref 150–400)
RBC: 3.78 MIL/uL — ABNORMAL LOW (ref 3.87–5.11)
RDW: 13.5 % (ref 11.5–15.5)
WBC: 2.7 10*3/uL — ABNORMAL LOW (ref 4.0–10.5)
nRBC: 0 % (ref 0.0–0.2)

## 2019-06-02 MED ORDER — IOHEXOL 300 MG/ML  SOLN
75.0000 mL | Freq: Once | INTRAMUSCULAR | Status: AC | PRN
  Administered 2019-06-02: 75 mL via INTRAVENOUS

## 2019-06-05 ENCOUNTER — Other Ambulatory Visit: Payer: Self-pay

## 2019-06-05 ENCOUNTER — Inpatient Hospital Stay (HOSPITAL_BASED_OUTPATIENT_CLINIC_OR_DEPARTMENT_OTHER): Payer: Medicare Other | Admitting: Hematology

## 2019-06-05 VITALS — BP 143/54 | HR 60 | Temp 96.8°F | Resp 18 | Wt 169.5 lb

## 2019-06-05 DIAGNOSIS — Z902 Acquired absence of lung [part of]: Secondary | ICD-10-CM | POA: Diagnosis not present

## 2019-06-05 DIAGNOSIS — C3491 Malignant neoplasm of unspecified part of right bronchus or lung: Secondary | ICD-10-CM

## 2019-06-05 DIAGNOSIS — Z87891 Personal history of nicotine dependence: Secondary | ICD-10-CM | POA: Diagnosis not present

## 2019-06-05 DIAGNOSIS — N189 Chronic kidney disease, unspecified: Secondary | ICD-10-CM | POA: Diagnosis not present

## 2019-06-05 NOTE — Progress Notes (Signed)
[place], [place] [address]   CLINIC:  Medical Oncology/Hematology  PCP:  Cindy Butler [address] / [place] [address] [phone]   REASON FOR VISIT:  Follow-up for adenocarcinoma of the lung  PRIOR THERAPY: -Status post resection on 08/23/2018. -4 cycles of carboplatin and pemetrexed from 09/26/2018 through 11/29/2018 in the adjuvant setting.  NGS Results: PD-L1 TPS 1%.  Foundation 1 TMB-low, MSI stable with no other targetable mutations.  CURRENT THERAPY: Observation  BRIEF ONCOLOGIC HISTORY:  Oncology History  Adenocarcinoma of right lung ([facility])  09/12/2018 Initial Diagnosis   Adenocarcinoma of right lung ([facility])   09/12/2018 Cancer Staging   Staging form: Lung, AJCC 8th Edition - Clinical stage from 09/12/2018: Stage IIB (cT3, cN0, cM0) - Signed by Derek Jack, MD on 09/12/2018   09/26/2018 -  Chemotherapy   The patient had palonosetron (ALOXI) injection 0.25 mg, 0.25 mg, Intravenous,  Once, 4 of 4 cycles Administration: 0.25 mg (09/26/2018), 0.25 mg (11/07/2018), 0.25 mg (11/29/2018), 0.25 mg (10/17/2018) pegfilgrastim (NEULASTA ONPRO KIT) injection 6 mg, 6 mg, Subcutaneous, Once, 4 of 4 cycles Administration: 6 mg (09/26/2018), 6 mg (11/07/2018), 6 mg (11/29/2018), 6 mg (10/17/2018) PEMEtrexed (ALIMTA) 1,000 mg in sodium chloride 0.9 % 100 mL chemo infusion, 520 mg/m2 = 950 mg, Intravenous,  Once, 4 of 4 cycles Administration: 1,000 mg (09/26/2018), 1,000 mg (11/07/2018), 1,000 mg (11/29/2018), 1,000 mg (10/17/2018) CARBOplatin (PARAPLATIN) 380 mg in sodium chloride 0.9 % 250 mL chemo infusion, 380 mg (100 % of original dose 383.5 mg), Intravenous,  Once, 4 of 4 cycles Dose modification:   (original dose 383.5 mg, Cycle 1),   (original dose 389.5 mg, Cycle 3),   (original dose 410 mg, Cycle 4),   (original dose 375.5 mg, Cycle 2) Administration: 380 mg (09/26/2018), 390 mg (11/07/2018), 390 mg (11/29/2018), 380 mg  (10/17/2018) fosaprepitant (EMEND) 150 mg, dexamethasone (DECADRON) 12 mg in sodium chloride 0.9 % 145 mL IVPB, , Intravenous,  Once, 4 of 4 cycles Administration:  (09/26/2018),  (11/07/2018),  (11/29/2018),  (10/17/2018)  for chemotherapy treatment.      CANCER STAGING: Cancer Staging Adenocarcinoma of right lung [facility]) Staging form: Lung, AJCC 8th Edition - Clinical stage from 09/12/2018: Stage IIB (cT3, cN0, cM0) - Signed by Derek Jack, MD on 09/12/2018   INTERVAL HISTORY:  [name], a 69 y.o. female, returns for routine follow-up of her lung cancer. Cindy Butler was last seen on 01/30/2019.  She denies any cough or chest pains.  Appetite is 50% and energy levels are 75%.  Chronic headaches are stable.  She notes that she is going through a separation with her husband which is taking a toll on her mental health at the moment. Otherwise, she is feeling well.    REVIEW OF SYSTEMS:  Review of Systems  Neurological: Positive for headaches.  Psychiatric/Behavioral: Positive for depression (Social issues) and sleep disturbance. The patient is nervous/anxious (Social issues).     PAST MEDICAL/SURGICAL HISTORY:  Past Medical History:  Diagnosis Date  . Esophageal reflux   . Hyperlipidemia   . Osteoporosis    Past Surgical History:  Procedure Laterality Date  . COLONOSCOPY  09/27/2010   Procedure: COLONOSCOPY;  Surgeon: Jamesetta So;  Location[facility];  Service: Gastroenterology;  Laterality: N/A;  . NASAL SINUS SURGERY    . TUBAL LIGATION    . VIDEO ASSISTED THORACOSCOPY (VATS)/ LOBECTOMY Right 08/23/2018   Procedure: Right Video Assisted Thoracoscopy (Vats)  with Right Upper Lobectomy and Node Dissection;  Surgeon: Melrose Nakayama, MD;  Location: [facility];  Service: Thoracic;  Laterality: Right;    SOCIAL HISTORY:  Social History   Socioeconomic History  . Marital status: Married    Spouse name: Cindy Butler  . Number of children: 3  . Years of education:  Not on file  . Highest education level: Not on file  Occupational History  . Occupation: retired  Tobacco Use  . Smoking status: Former Smoker    Packs/day: 0.50    Years: 55.00    Pack years: 27.50    Types: Cigarettes    Quit date: 08/16/2018    Years since quitting: 0.8  . Smokeless tobacco: Never Used  Substance and Sexual Activity  . Alcohol use: Yes    Alcohol/week: 3.0 standard drinks    Types: 3 Standard drinks or equivalent per week  . Drug use: Never  . Sexual activity: Not on file  Other Topics Concern  . Not on file  Social History Narrative  . Not on file   Social Determinants of Health   Financial Resource Strain:   . Difficulty of Paying Living Expenses:   Food Insecurity:   . Worried About Charity fundraiser in the Last Year:   . Arboriculturist in the Last Year:   Transportation Needs:   . Film/video editor (Medical):   Marland Kitchen Lack of Transportation (Non-Medical):   Physical Activity:   . Days of Exercise per Week:   . Minutes of Exercise per Session:   Stress:   . Feeling of Stress :   Social Connections:   . Frequency of Communication with Friends and Family:   . Frequency of Social Gatherings with Friends and Family:   . Attends Religious Services:   . Active Member of Clubs or Organizations:   . Attends Archivist Meetings:   Marland Kitchen Marital Status:   Intimate Partner Violence:   . Fear of Current or Ex-Partner:   . Emotionally Abused:   Marland Kitchen Physically Abused:   . Sexually Abused:     FAMILY HISTORY:  Family History  Problem Relation Age of Onset  . Bone cancer Mother   . Alcohol abuse Father   . Melanoma Brother   . Breast cancer Maternal Grandmother     CURRENT MEDICATIONS:  Current Outpatient Medications  Medication Sig Dispense Refill  . Ascorbic Acid (VITAMIN C) 100 MG tablet Take 100 mg by mouth daily.    Marland Kitchen aspirin EC 81 MG tablet Take 81 mg by mouth daily.    . Calcium Carb-Cholecalciferol (CALCIUM 600+D) 600-800 MG-UNIT  TABS Take 1 tablet by mouth daily.    . Cyanocobalamin (VITAMIN B 12 PO) Take 1,200 mg by mouth daily.    . cycloSPORINE (RESTASIS) 0.05 % ophthalmic emulsion Place 1 drop into both eyes 2 (two) times daily. 0.4 mL 3  . ferrous sulfate 325 (65 FE) MG tablet Take 325 mg by mouth daily with breakfast.    . omeprazole (PRILOSEC) 40 MG capsule Take 40 mg by mouth daily.     . simvastatin (ZOCOR) 20 MG tablet Take 20 mg by mouth daily at 6 PM.      No current facility-administered medications for this visit.    ALLERGIES:  No Known Allergies  PHYSICAL EXAM:  Performance status (ECOG): 1 - Symptomatic but completely ambulatory  Vitals:   06/05/19 1124  BP: (!) 143/54  Pulse: 60  Resp: 18  Temp: (!)  96.8 F (36 C)  SpO2: 100%   Wt Readings from Last 3 Encounters:  06/05/19 169 lb 8 oz (76.9 kg)  04/23/19 173 lb (78.5 kg)  01/30/19 176 lb (79.8 kg)   Physical Exam Vitals and nursing note reviewed.  Constitutional:      Appearance: Normal appearance.  Cardiovascular:     Rate and Rhythm: Normal rate and regular rhythm.  Pulmonary:     Effort: Pulmonary effort is normal.     Breath sounds: Normal breath sounds.  Abdominal:     General: There is no distension.     Palpations: Abdomen is soft. There is no mass.  Skin:    General: Skin is warm.  Neurological:     General: No focal deficit present.     Mental Status: She is alert and oriented to person, place, and time.  Psychiatric:        Mood and Affect: Mood normal.        Behavior: Behavior normal.     LABORATORY DATA:  I have reviewed the labs as listed.  CBC Latest Ref Rng & Units 06/02/2019 01/28/2019 12/19/2018  WBC 4.0 - 10.5 K/uL 2.7(L) 2.6(L) 2.9(L)  Hemoglobin 12.0 - 15.0 g/dL 12.1 11.5(L) 8.8(L)  Hematocrit 36.0 - 46.0 % 37.4 35.3(L) 27.3(L)  Platelets 150 - 400 K/uL 169 141(L) 151   CMP Latest Ref Rng & Units 06/02/2019 01/28/2019 12/19/2018  Glucose 70 - 99 mg/dL 112(H) 110(H) 133(H)  BUN 8 - 23 mg/dL _0 Creatinine 0.44 - 1.00 mg/dL 0.98 1.05(H) 1.14(H)  Sodium 135 - 145 mmol/L 138 139 141  Potassium 3.5 - 5.1 mmol/L 4.0 4.2 4.5  Chloride 98 - 111 mmol/L 103 105 107  CO2 22 - 32 mmol/L _1 Calcium 8.9 - 10.3 mg/dL 9.8 9.3 9.5  Total Protein 6.5 - 8.1 g/dL 7.6 7.5 7.3  Total Bilirubin 0.3 - 1.2 mg/dL 0.5 0.6 0.2(L)  Alkaline Phos 38 - 126 U/L 88 101 107  AST 15 - 41 U/L 27 53(H) 30  ALT 0 - 44 U/L 19 40 24    DIAGNOSTIC IMAGING:  I have independently reviewed the scans and discussed with the patient. CT Chest W Contrast  Result Date: 06/02/2019 CLINICAL DATA:  Follow-up right lung adenocarcinoma. Status post surgical resection and chemotherapy. EXAM: CT CHEST WITH CONTRAST TECHNIQUE: Multidetector CT imaging of the chest was performed during intravenous contrast administration. CONTRAST:  27m OMNIPAQUE IOHEXOL 300 MG/ML  SOLN COMPARISON:  01/28/2019 FINDINGS: Cardiovascular: No acute findings. Aortic and coronary artery atherosclerosis noted. Mediastinum/Nodes: Small mediastinal lymph nodes in the right paraaortic region and AP window are again seen measuring up to 10 mm, without significant change. Small less than 1 cm bilateral hilar lymph nodes are again seen in also without significant change. No new or increased areas of lymphadenopathy identified. Lungs/Pleura: Postop changes from previous right upper lobectomy remains stable. Small loculated pleural fluid collection in the right lung apex is unchanged as well as other areas of pleural-parenchymal scarring. No suspicious nodules or masses identified. No evidence of pulmonary infiltrate or pleural effusion. Upper Abdomen: Normal adrenal glands. Small calcified gallstones again seen. Musculoskeletal:  No suspicious bone lesions. IMPRESSION: Stable postop changes in right hemithorax. Stable small mediastinal and bilateral hilar lymph nodes. No evidence of recurrent or metastatic carcinoma. Aortic Atherosclerosis (ICD10-I70.0).  Electronically Signed   By: JMarlaine HindM.D.   On: 06/02/2019 11:30   MM 3D SCREEN BREAST BILATERAL  Result  Date: 06/02/2019 CLINICAL DATA:  Screening. EXAM: DIGITAL SCREENING BILATERAL MAMMOGRAM WITH TOMO AND CAD COMPARISON:  Previous exam(s). ACR Breast Density Category b: There are scattered areas of fibroglandular density. FINDINGS: There are no findings suspicious for malignancy. Images were processed with CAD. IMPRESSION: No mammographic evidence of malignancy. A result letter of this screening mammogram will be mailed directly to the patient. RECOMMENDATION: Screening mammogram in one year. (Code:SM-B-01Y) BI-RADS CATEGORY  1: Negative. Electronically Signed   By: Evangeline Dakin M.D.   On: 06/02/2019 16:36     ASSESSMENT:  Adenocarcinoma of right lung ([facility]) 1.  T3N0 right lung adenocarcinoma: -Resection on 08/23/2018 with pathology showing adenocarcinoma, 2 tumors, 1.3 cm and 0.7 cm, margins negative.  0/15 lymph nodes positive. -4 cycles of carboplatin and pemetrexed from 09/26/2018 through 11/29/2018. -CT chest with contrast on 06/02/2019 shows postoperative changes in the right hemithorax.  Stable small mediastinal and bilateral hilar lymph nodes.  No evidence of recurrence or metastatic carcinoma.    PLAN:  1.  T3N0 right lung adenocarcinoma: -We reviewed results of latest CT of the chest on 06/02/2018 did not show any evidence of recurrence. -She does not have any new cough or chest pains. -She will continue CT scans every 6 months. -She would like to relocate to [place] area.  We will make a referral to medical oncology to establish follow-up care.  2.  CKD: -She has very mild CKD with GFR 55-60.  3.  Health maintenance: -Mammogram on 06/02/2019 was BI-RADS Category 1.    Orders placed this encounter:  No orders of the defined types were placed in this encounter.    Derek Jack, MD [facility] [phone]   I, Jacqualyn Posey, am acting as a  scribe for Dr. Sanda Linger.  I, Derek Jack MD, have reviewed the above documentation for accuracy and completeness, and I agree with the above.

## 2019-06-05 NOTE — Patient Instructions (Signed)
[address] at [facility] Discharge Instructions  You were seen today by Dr. Delton Coombes. He went over your recent results. We will be making your referral out to [facility] in [place], [place] located at [address], [place], [place] [address].    Thank you for choosing [place] at [facility] to provide your oncology and hematology care.  To afford each patient quality time with our provider, please arrive at least 15 minutes before your scheduled appointment time.   If you have a lab appointment with the [facility] please come in thru the  Main Entrance and check in at the main information desk  You need to re-schedule your appointment should you arrive 10 or more minutes late.  We strive to give you quality time with our providers, and arriving late affects you and other patients whose appointments are after yours.  Also, if you no show three or more times for appointments you may be dismissed from the clinic at the providers discretion.     Again, thank you for choosing [facility].  Our hope is that these requests will decrease the amount of time that you wait before being seen by our physicians.       _____________________________________________________________  Should you have questions after your visit to [facility], please contact our office at [phone] between the hours of 8:00 a.m. and 4:30 p.m.  Voicemails left after 4:00 p.m. will not be returned until the following business day.  For prescription refill requests, have your pharmacy contact our office and allow 72 hours.    Cancer Center Support Programs:   > Cancer Support Group  2nd Tuesday of the month 1pm-2pm, Journey Room

## 2019-06-06 DIAGNOSIS — K219 Gastro-esophageal reflux disease without esophagitis: Secondary | ICD-10-CM | POA: Diagnosis not present

## 2019-06-06 DIAGNOSIS — Z1389 Encounter for screening for other disorder: Secondary | ICD-10-CM | POA: Diagnosis not present

## 2019-06-06 DIAGNOSIS — F17201 Nicotine dependence, unspecified, in remission: Secondary | ICD-10-CM | POA: Diagnosis not present

## 2019-06-06 DIAGNOSIS — J449 Chronic obstructive pulmonary disease, unspecified: Secondary | ICD-10-CM | POA: Diagnosis not present

## 2019-06-06 DIAGNOSIS — M810 Age-related osteoporosis without current pathological fracture: Secondary | ICD-10-CM | POA: Diagnosis not present

## 2019-06-06 DIAGNOSIS — Z0000 Encounter for general adult medical examination without abnormal findings: Secondary | ICD-10-CM | POA: Diagnosis not present

## 2019-06-06 DIAGNOSIS — Z6828 Body mass index (BMI) 28.0-28.9, adult: Secondary | ICD-10-CM | POA: Diagnosis not present

## 2019-06-06 DIAGNOSIS — E7849 Other hyperlipidemia: Secondary | ICD-10-CM | POA: Diagnosis not present

## 2019-10-09 DIAGNOSIS — M810 Age-related osteoporosis without current pathological fracture: Secondary | ICD-10-CM | POA: Diagnosis not present

## 2019-10-09 DIAGNOSIS — Z720 Tobacco use: Secondary | ICD-10-CM | POA: Diagnosis not present

## 2019-10-09 DIAGNOSIS — J449 Chronic obstructive pulmonary disease, unspecified: Secondary | ICD-10-CM | POA: Diagnosis not present

## 2019-10-09 DIAGNOSIS — E7849 Other hyperlipidemia: Secondary | ICD-10-CM | POA: Diagnosis not present

## 2019-11-07 ENCOUNTER — Ambulatory Visit: Payer: Medicare Other | Admitting: Cardiovascular Disease

## 2019-11-08 DIAGNOSIS — Z720 Tobacco use: Secondary | ICD-10-CM | POA: Diagnosis not present

## 2019-11-08 DIAGNOSIS — M810 Age-related osteoporosis without current pathological fracture: Secondary | ICD-10-CM | POA: Diagnosis not present

## 2019-11-08 DIAGNOSIS — E7849 Other hyperlipidemia: Secondary | ICD-10-CM | POA: Diagnosis not present

## 2019-11-08 DIAGNOSIS — J449 Chronic obstructive pulmonary disease, unspecified: Secondary | ICD-10-CM | POA: Diagnosis not present

## 2019-12-09 DIAGNOSIS — E7849 Other hyperlipidemia: Secondary | ICD-10-CM | POA: Diagnosis not present

## 2019-12-09 DIAGNOSIS — Z720 Tobacco use: Secondary | ICD-10-CM | POA: Diagnosis not present

## 2019-12-09 DIAGNOSIS — J449 Chronic obstructive pulmonary disease, unspecified: Secondary | ICD-10-CM | POA: Diagnosis not present

## 2019-12-09 DIAGNOSIS — M810 Age-related osteoporosis without current pathological fracture: Secondary | ICD-10-CM | POA: Diagnosis not present

## 2019-12-11 DIAGNOSIS — Z23 Encounter for immunization: Secondary | ICD-10-CM | POA: Diagnosis not present

## 2019-12-11 DIAGNOSIS — C3490 Malignant neoplasm of unspecified part of unspecified bronchus or lung: Secondary | ICD-10-CM | POA: Diagnosis not present

## 2019-12-11 DIAGNOSIS — K8020 Calculus of gallbladder without cholecystitis without obstruction: Secondary | ICD-10-CM | POA: Diagnosis not present

## 2019-12-11 DIAGNOSIS — C3411 Malignant neoplasm of upper lobe, right bronchus or lung: Secondary | ICD-10-CM | POA: Diagnosis not present

## 2019-12-19 DIAGNOSIS — Z322 Encounter for childbirth instruction: Secondary | ICD-10-CM | POA: Diagnosis not present

## 2019-12-19 DIAGNOSIS — C3411 Malignant neoplasm of upper lobe, right bronchus or lung: Secondary | ICD-10-CM | POA: Diagnosis not present

## 2019-12-19 DIAGNOSIS — Z23 Encounter for immunization: Secondary | ICD-10-CM | POA: Diagnosis not present

## 2020-12-07 IMAGING — PT NUCLEAR MEDICINE PET IMAGE INITIAL (PI) SKULL BASE TO THIGH
3 series · 22 of 25 positions shown · non-contrast
Comparison: Chest CT from 07/25/2018

CLINICAL DATA: Initial treatment strategy for pulmonary nodules.

EXAM:
NUCLEAR MEDICINE PET SKULL BASE TO THIGH
TECHNIQUE: 0.1 mCi F-18 FDG was injected intravenously. Full-ring PET imaging
was performed from the skull base to thigh after the radiotracer. CT
data was obtained and used for attenuation correction and anatomic
localization.
Fasting blood glucose: 101 mg/dl

[axial ct wb fusion · 15 of 155 slices shown]
[im 1/155]
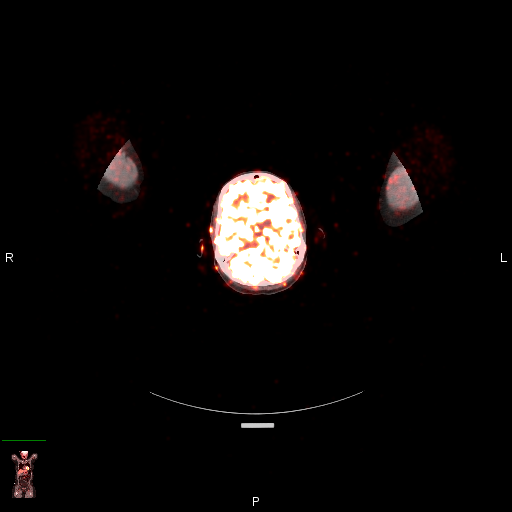
[im 10/155]
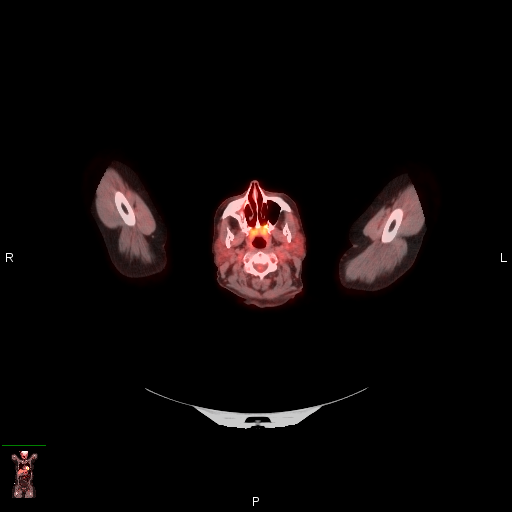
[im 20/155]
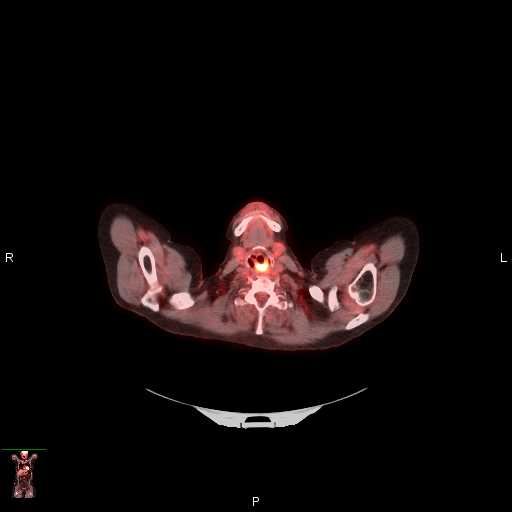
[im 29/155]
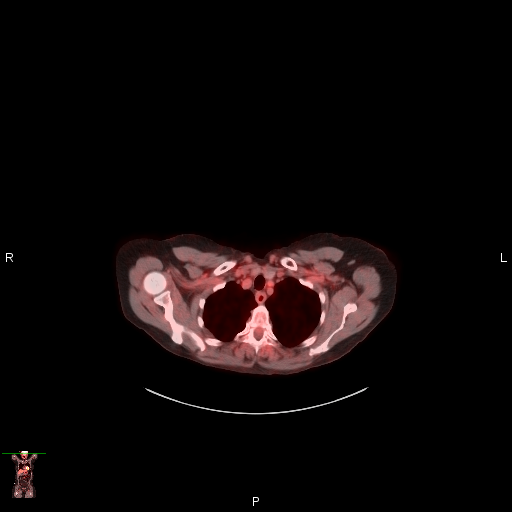
[im 49/155]
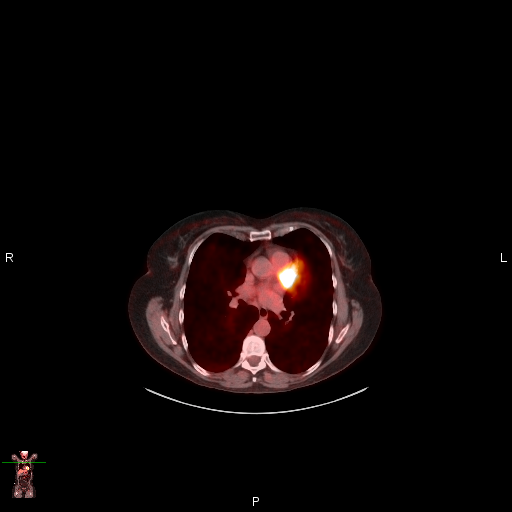
[im 58/155]
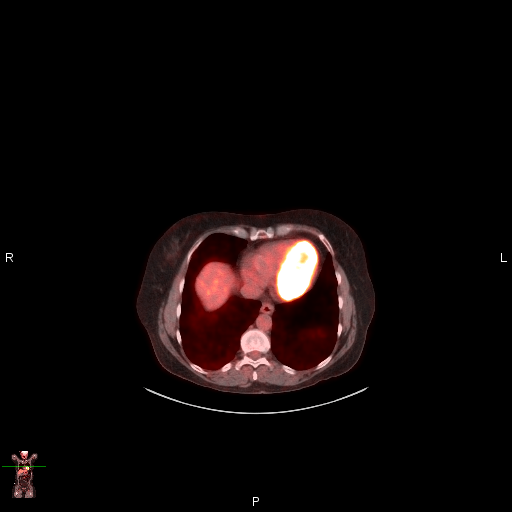
[im 68/155]
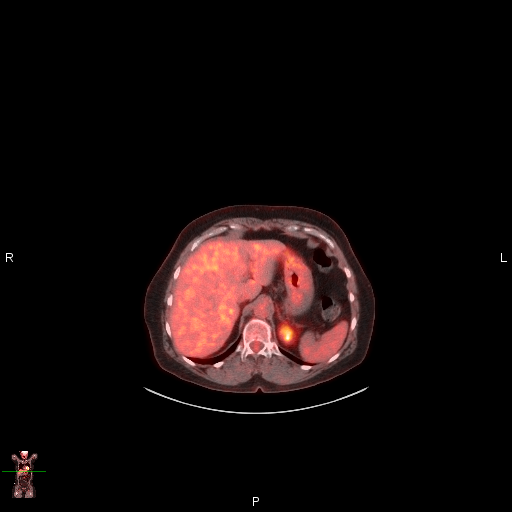
[im 78/155]
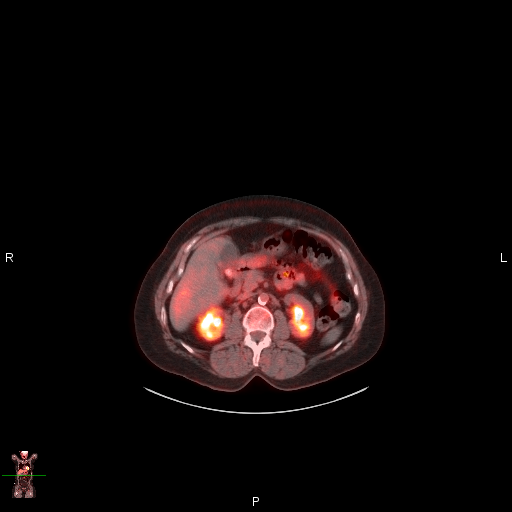
[im 87/155]
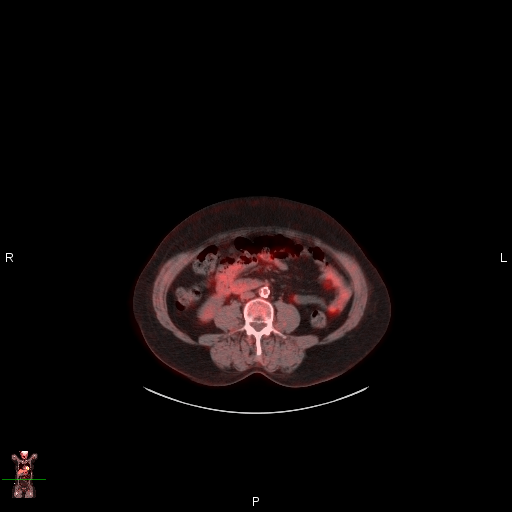
[im 97/155]
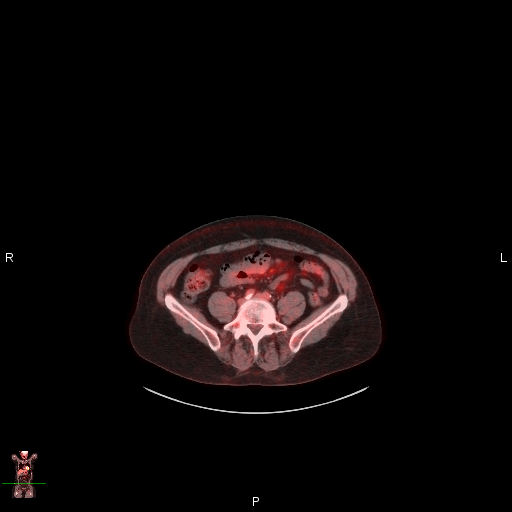
[im 106/155]
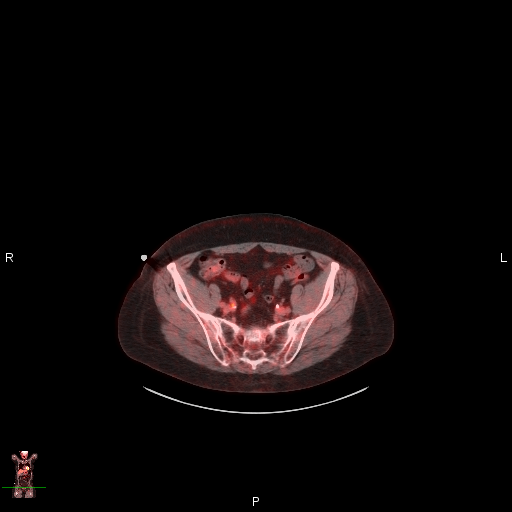
[im 126/155]
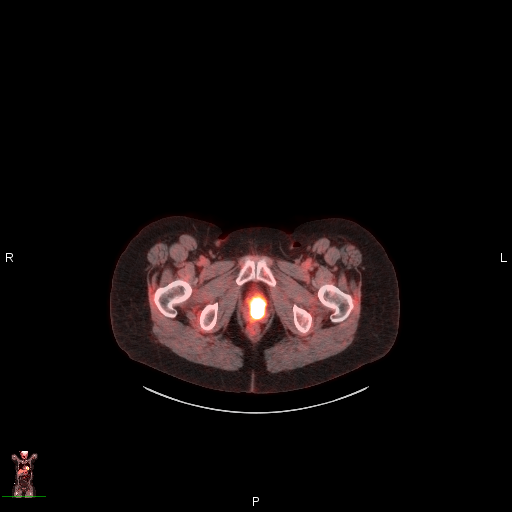
[im 135/155]
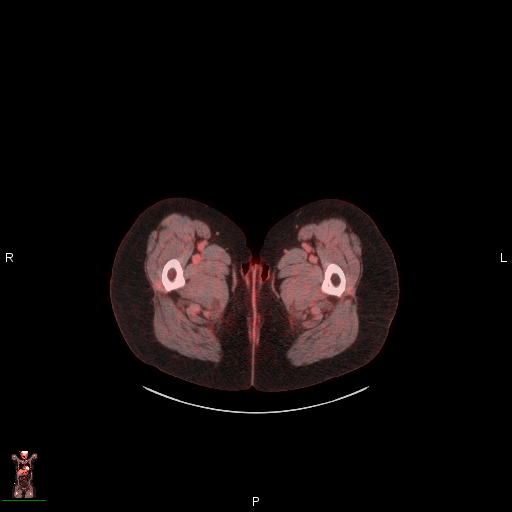
[im 145/155]
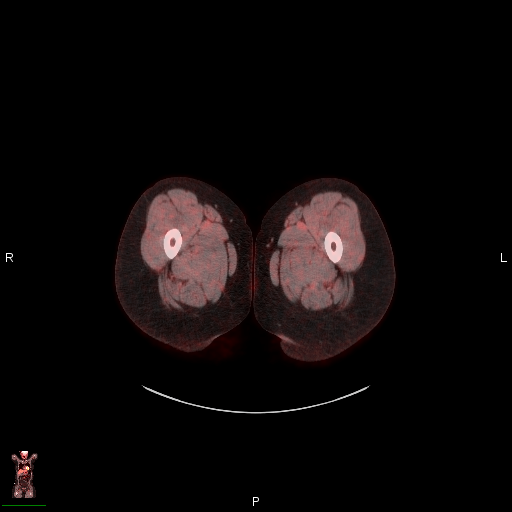
[im 155/155]
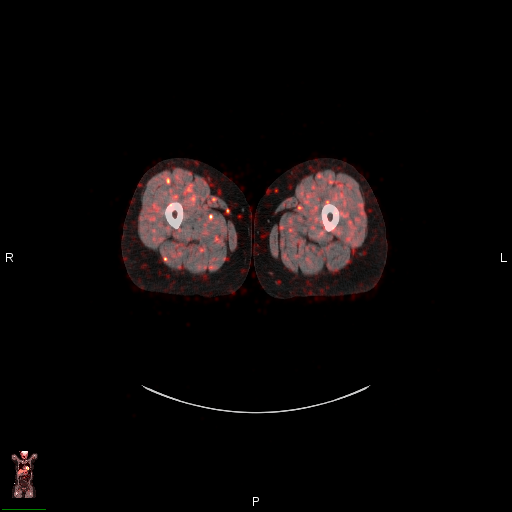

[mip · 4 of 48 slices shown]
[im 1/48]
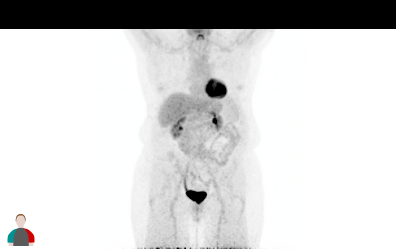
[im 12/48]
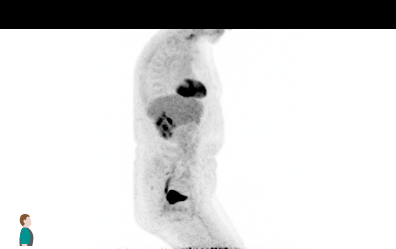
[im 24/48]
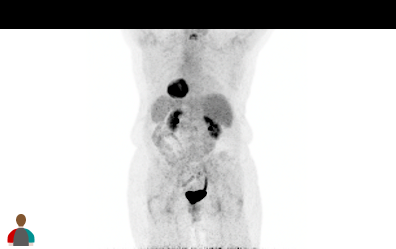
[im 48/48]
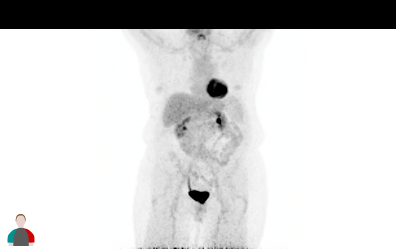

[coronal ct wb fusion · 3 of 25 slices shown]
[im 1/25]
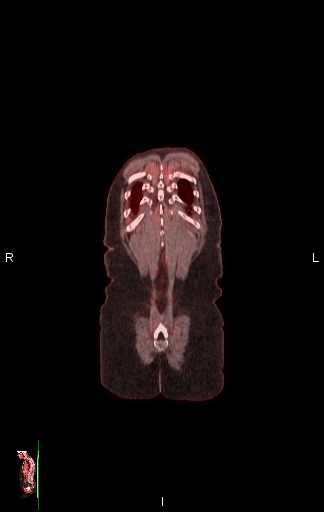
[im 13/25]
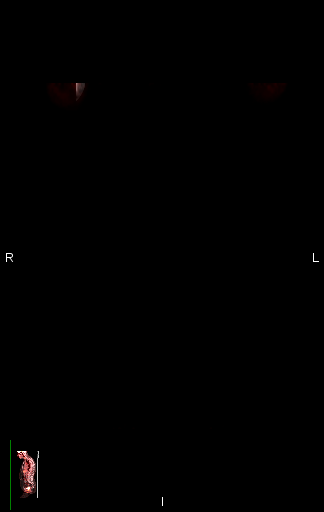
[im 25/25]
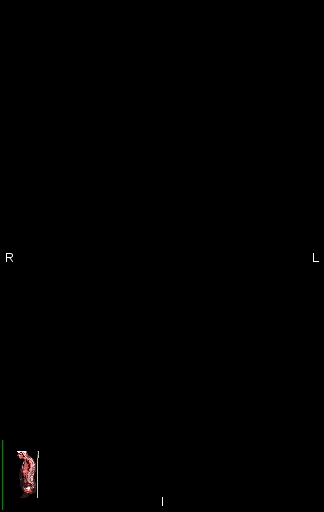

[22 of 25 positions shown; findings below may reference images not displayed]

FINDINGS: Mediastinal blood pool activity: SUV max

Liver activity: SUV max NA

NECK: Mild symmetric glottic activity is likely physiologic. No
significant abnormal hypermetabolic activity is identified.

Incidental CT findings: Common carotid atherosclerotic calcification
noted.

CHEST: A spiculated 1.5 by 0.7 cm right upper lobe pulmonary nodule
on image 77/3 has a maximum SUV of 2.3.

A part solid 0.6 cm right upper lobe pulmonary nodule on image 67/3
has a maximum SUV of 1.2.

Incidental CT findings: Atherosclerotic calcification of the aortic
arch and branch vessels. Lower right paratracheal node 0.9 cm in
short axis on image 89/3, maximum SUV 2.4.

ABDOMEN/PELVIS: No significant abnormal hypermetabolic activity in
this region.

Incidental CT findings: Multiple gallstones noted in the
gallbladder. Aortoiliac atherosclerotic vascular disease.

SKELETON: No significant abnormal hypermetabolic activity in this
region.

Incidental CT findings: Chronic right maxillary sinusitis.
IMPRESSION: 1. The dominant spiculated right upper lobe nodule has maximum SUV
of 2.3 which is minimally above blood pool. Given the morphology,
the overall appearance is suspicious for low-grade malignancy.
2. There is an adjacent part solid 0.6 cm right upper lobe pulmonary
nodule with maximum SUV of 1.2, nonspecific.
3. No hypermetabolic adenopathy.
4.  Aortic Atherosclerosis ([id number]).
5. Cholelithiasis.
6. Chronic right maxillary sinusitis.

## 2021-01-19 IMAGING — CR DG CHEST 2V
2 series · 2 of 2 positions shown · non-contrast
Comparison: Chest x-rays dated 08/30/2018, 08/26/2018 and
08/20/2018. Chest CT dated 07/25/2018

CLINICAL DATA: RIGHT upper lobe nodule.

EXAM:
CHEST - 2 VIEW

[w chest pa]
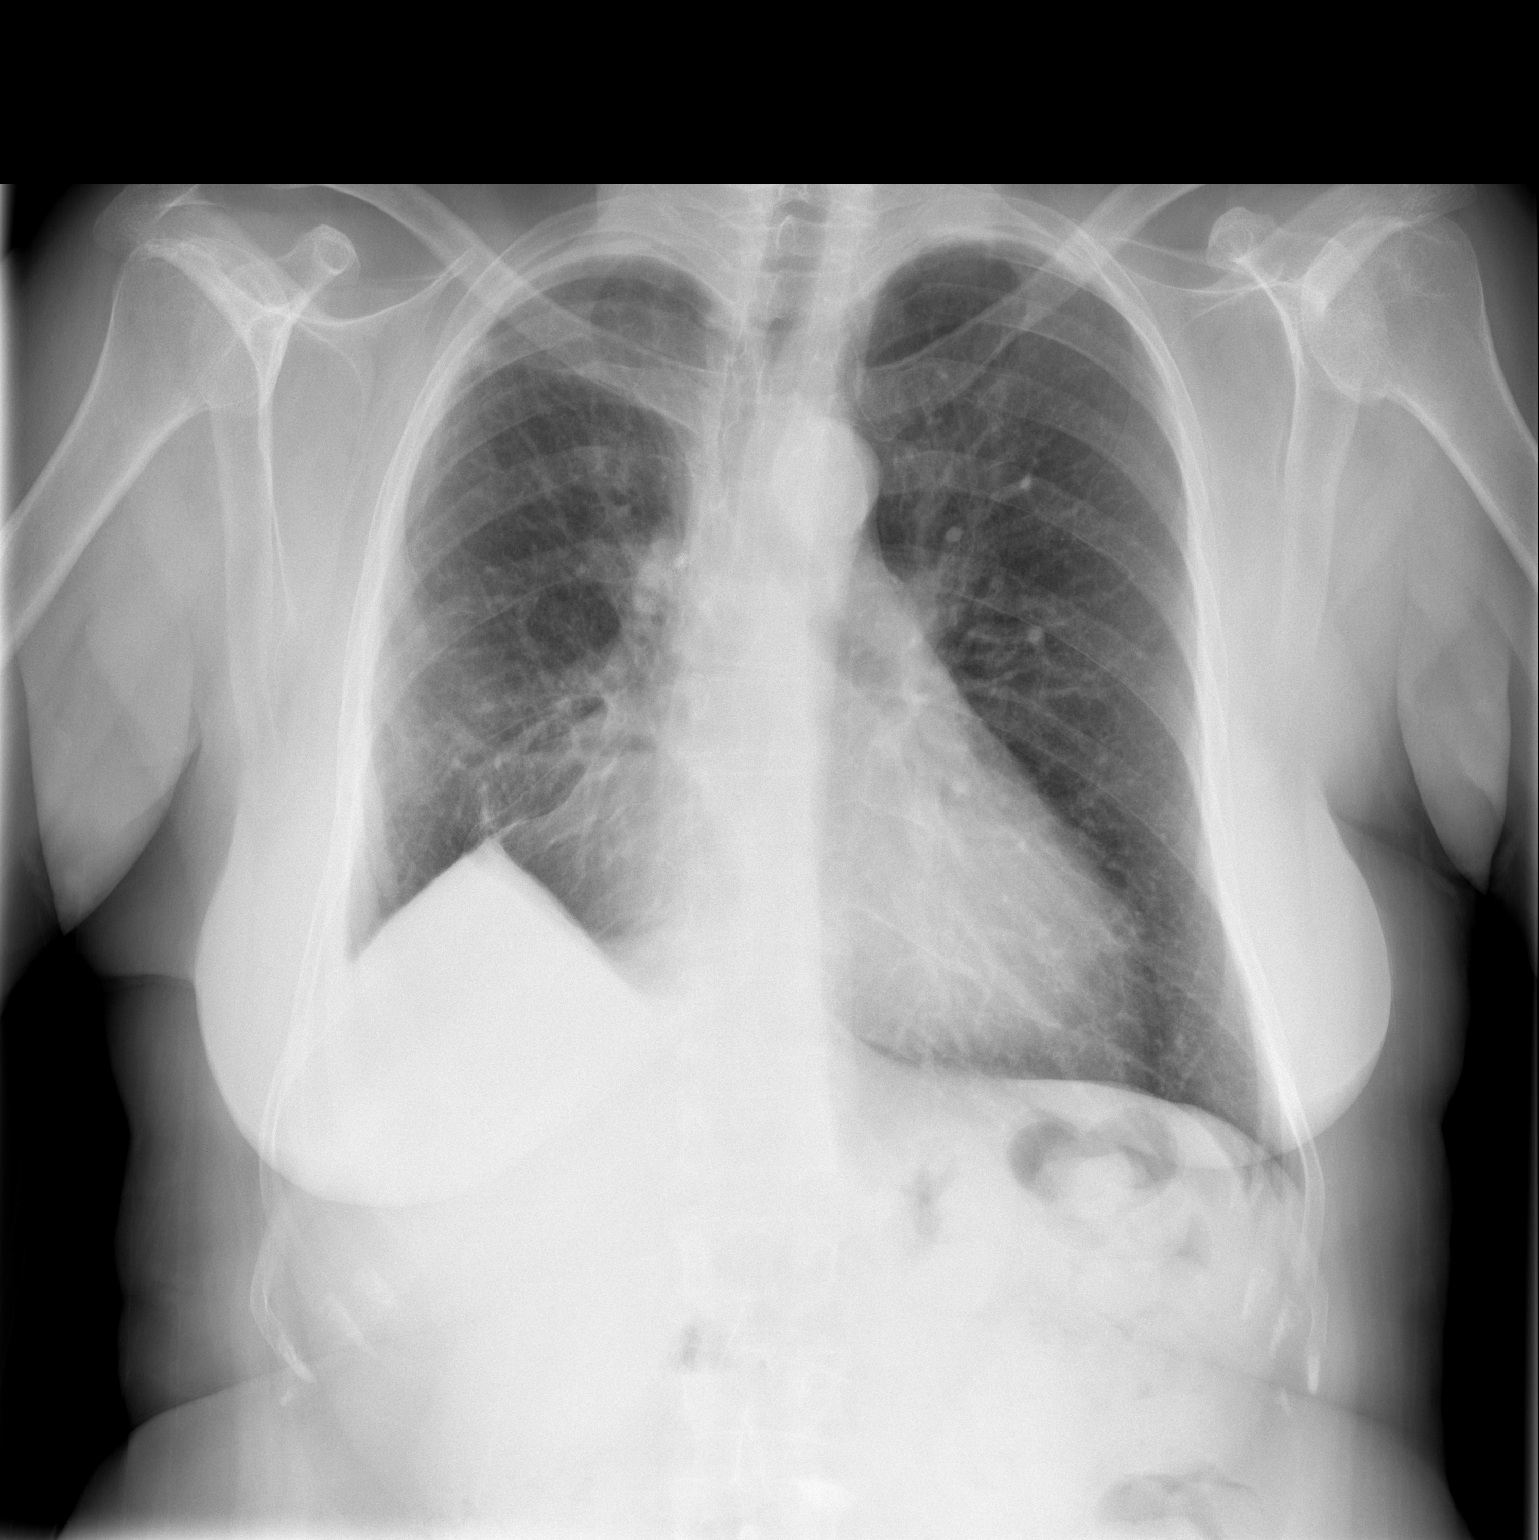

[w chest lat]
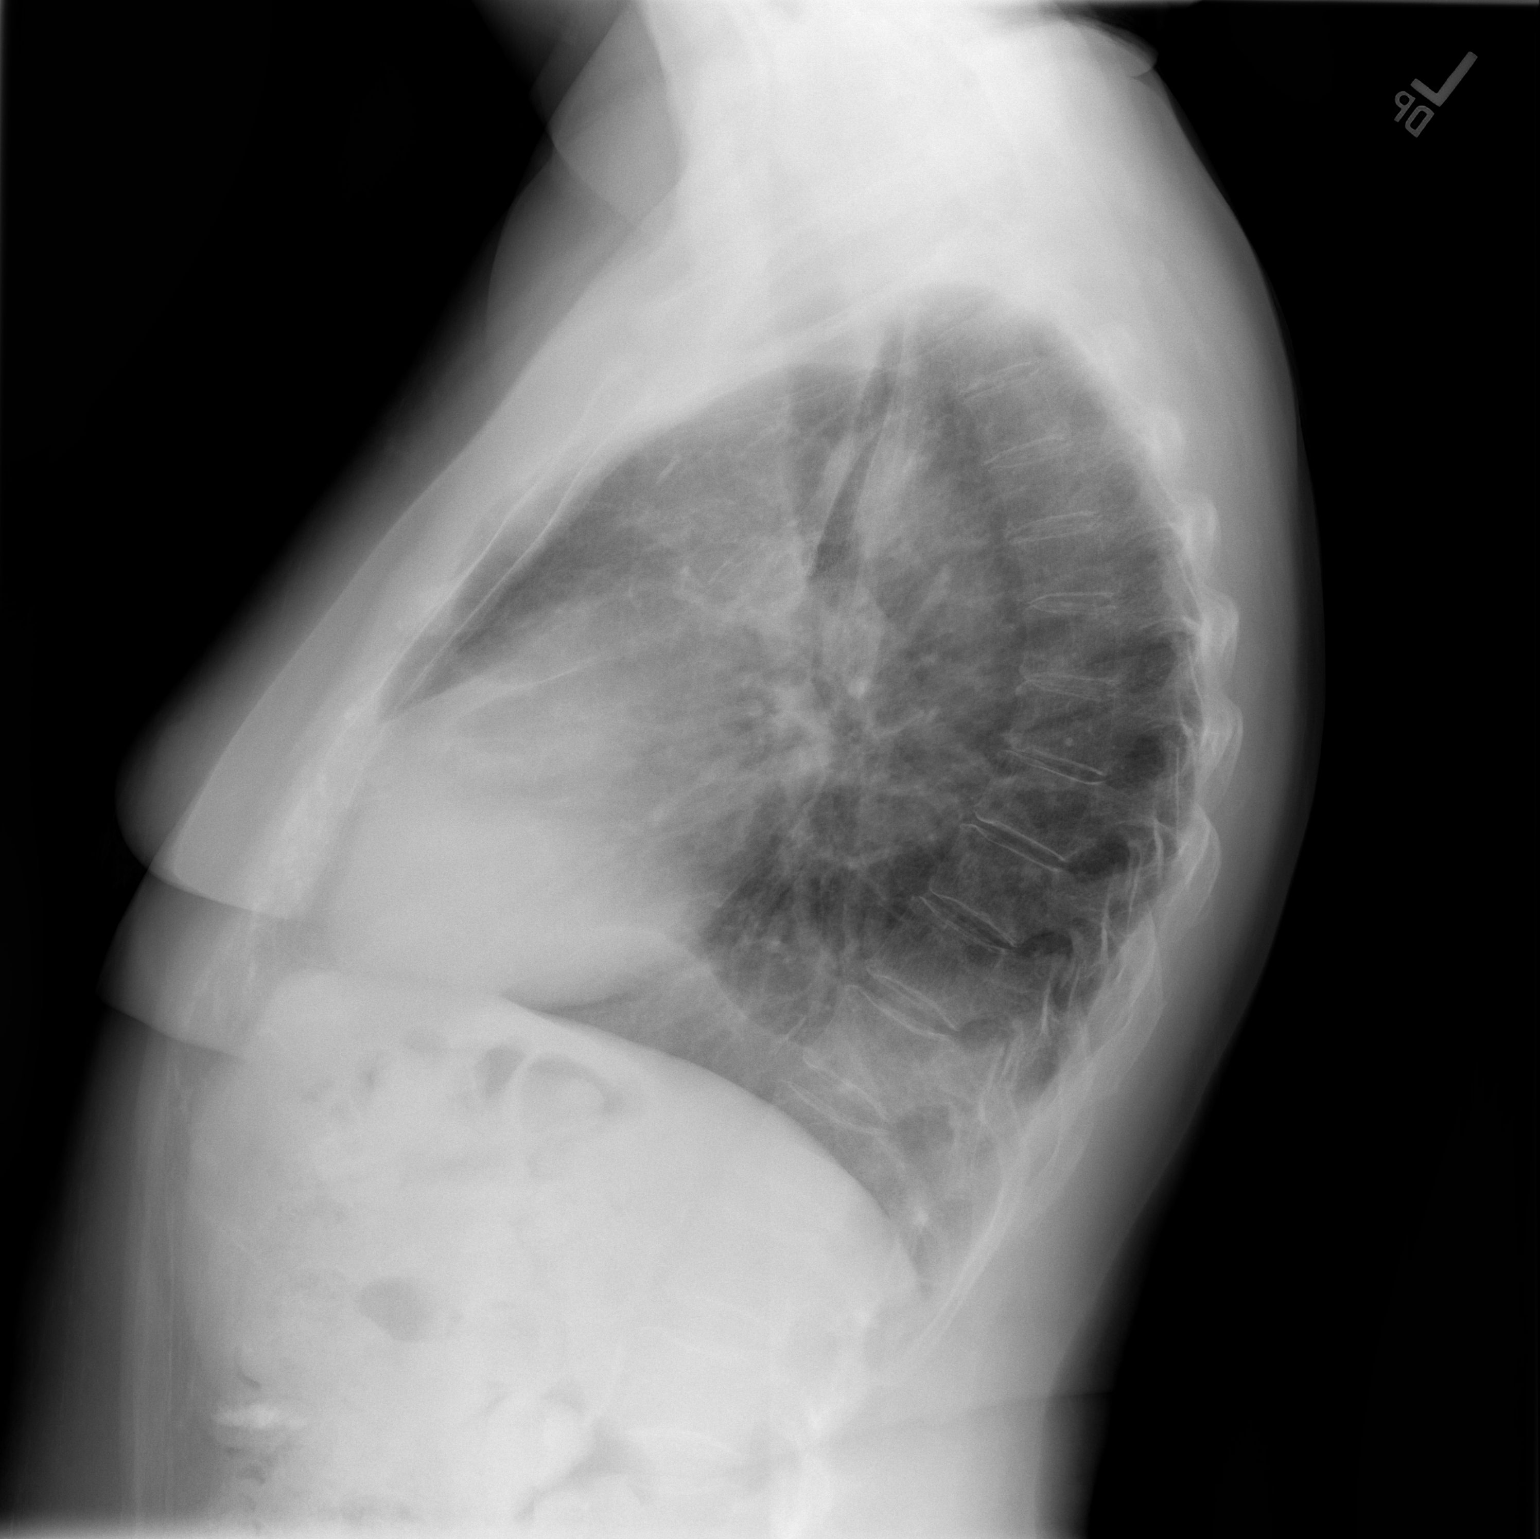

[2 of 2 positions shown; findings below may reference images not displayed]

FINDINGS: There is increased tenting at the RIGHT lung base, presumably
related to the given history of recent VATS procedure. Lungs are
otherwise clear. No pulmonary nodule or mass is identified on
today's chest x-ray. No pleural effusion. Previously described
pneumothorax at the RIGHT lung apex has resolved. Osseous structures
about the chest are unremarkable.
IMPRESSION: 1. Increased tenting at the RIGHT lung base, presumably due to
volume loss related to the given history of recent VATS procedure.
2. Lungs are otherwise clear. No evidence of pneumonia or pulmonary
edema. No pleural effusion or pneumothorax is seen on today's exam.

## 2021-06-18 ENCOUNTER — Other Ambulatory Visit: Payer: Self-pay | Admitting: Nurse Practitioner

## 2022-07-10 ENCOUNTER — Encounter (HOSPITAL_COMMUNITY): Payer: Self-pay | Admitting: Family Medicine
